# Patient Record
Sex: Male | Born: 1950
Health system: Southern US, Community
[De-identification: ages and names within clinical notes are randomized; demographics above are authoritative.]

## PROBLEM LIST (undated history)

## (undated) DIAGNOSIS — M1990 Unspecified osteoarthritis, unspecified site: Secondary | ICD-10-CM

## (undated) DIAGNOSIS — I10 Essential (primary) hypertension: Secondary | ICD-10-CM

## (undated) DIAGNOSIS — K921 Melena: Secondary | ICD-10-CM

## (undated) DIAGNOSIS — R040 Epistaxis: Secondary | ICD-10-CM

## (undated) DIAGNOSIS — G709 Myoneural disorder, unspecified: Secondary | ICD-10-CM

## (undated) DIAGNOSIS — I6521 Occlusion and stenosis of right carotid artery: Secondary | ICD-10-CM

## (undated) DIAGNOSIS — R3129 Other microscopic hematuria: Secondary | ICD-10-CM

## (undated) DIAGNOSIS — K219 Gastro-esophageal reflux disease without esophagitis: Secondary | ICD-10-CM

## (undated) DIAGNOSIS — E041 Nontoxic single thyroid nodule: Secondary | ICD-10-CM

## (undated) HISTORY — DX: Melena: K92.1

## (undated) HISTORY — DX: Myoneural disorder, unspecified: G70.9

## (undated) HISTORY — DX: Occlusion and stenosis of right carotid artery: I65.21

## (undated) HISTORY — DX: Epistaxis: R04.0

## (undated) HISTORY — DX: Unspecified osteoarthritis, unspecified site: M19.90

## (undated) HISTORY — PX: COLONOSCOPY: SHX174

## (undated) HISTORY — DX: Other microscopic hematuria: R31.29

## (undated) HISTORY — DX: Nontoxic single thyroid nodule: E04.1

## (undated) HISTORY — PX: SHOULDER SURGERY: SHX246

## (undated) HISTORY — DX: Gastro-esophageal reflux disease without esophagitis: K21.9

---

## 2008-08-20 ENCOUNTER — Emergency Department (HOSPITAL_COMMUNITY): Admission: EM | Admit: 2008-08-20 | Discharge: 2008-08-20 | Payer: Self-pay | Admitting: Family Medicine

## 2008-08-28 ENCOUNTER — Emergency Department (HOSPITAL_COMMUNITY): Admission: EM | Admit: 2008-08-28 | Discharge: 2008-08-28 | Payer: Self-pay | Admitting: Family Medicine

## 2009-11-09 ENCOUNTER — Emergency Department (HOSPITAL_COMMUNITY): Admission: EM | Admit: 2009-11-09 | Discharge: 2009-11-09 | Payer: Self-pay | Admitting: Family Medicine

## 2010-01-02 ENCOUNTER — Ambulatory Visit (HOSPITAL_BASED_OUTPATIENT_CLINIC_OR_DEPARTMENT_OTHER): Admission: RE | Admit: 2010-01-02 | Discharge: 2010-01-02 | Payer: Self-pay | Admitting: Orthopedic Surgery

## 2010-06-29 LAB — POCT HEMOGLOBIN-HEMACUE: Hemoglobin: 13.4 g/dL (ref 13.0–17.0)

## 2010-07-25 LAB — POCT URINALYSIS DIP (DEVICE)
Bilirubin Urine: NEGATIVE
Glucose, UA: NEGATIVE mg/dL
Hgb urine dipstick: NEGATIVE
Ketones, ur: NEGATIVE mg/dL
Nitrite: NEGATIVE
Protein, ur: 30 mg/dL — AB
Specific Gravity, Urine: 1.02 (ref 1.005–1.030)
Urobilinogen, UA: 1 mg/dL (ref 0.0–1.0)
pH: 7.5 (ref 5.0–8.0)

## 2010-07-25 LAB — HERPES SIMPLEX VIRUS CULTURE: Culture: NOT DETECTED

## 2010-07-25 LAB — RPR: RPR Ser Ql: NONREACTIVE

## 2010-07-25 LAB — HIV ANTIBODY (ROUTINE TESTING W REFLEX): HIV: NONREACTIVE

## 2014-03-15 ENCOUNTER — Encounter (HOSPITAL_COMMUNITY): Payer: Self-pay | Admitting: Emergency Medicine

## 2014-03-15 ENCOUNTER — Emergency Department (HOSPITAL_COMMUNITY)
Admission: EM | Admit: 2014-03-15 | Discharge: 2014-03-15 | Disposition: A | Discharge status: Home / Self Care | Payer: BC Managed Care – PPO | Source: Home / Self Care | Attending: Emergency Medicine | Admitting: Emergency Medicine

## 2014-03-15 DIAGNOSIS — J069 Acute upper respiratory infection, unspecified: Secondary | ICD-10-CM

## 2014-03-15 HISTORY — DX: Essential (primary) hypertension: I10

## 2014-03-15 MED ORDER — IPRATROPIUM BROMIDE 0.06 % NA SOLN: 2.0000 | Freq: Four times a day (QID) | NASAL | Status: DC

## 2014-03-15 MED ORDER — PREDNISONE 20 MG PO TABS: 20.0000 mg | ORAL_TABLET | Freq: Two times a day (BID) | ORAL | Status: DC

## 2014-03-15 MED ORDER — HYDROCOD POLST-CHLORPHEN POLST 10-8 MG/5ML PO LQCR
5.0000 mL | Freq: Two times a day (BID) | ORAL | Status: DC | PRN
Start: 2014-03-15 — End: 2017-05-08

## 2014-03-15 MED ORDER — ALBUTEROL SULFATE HFA 108 (90 BASE) MCG/ACT IN AERS: 1.0000 | INHALATION_SPRAY | Freq: Four times a day (QID) | RESPIRATORY_TRACT | Status: DC | PRN

## 2014-03-15 NOTE — ED Notes (Signed)
couugh  X  5  Days     Sitting  Upright  On then exam table  No  Acute  distress

## 2014-03-15 NOTE — ED Provider Notes (Signed)
  Chief Complaint   Cough   History of Present Illness   Stephen Butler is a 63 year old male who has had a six-day history of cough productive yellow sputum, nasal congestion, rhinorrhea, headache, subjective fever, chills, sweats, and sore throat. He denies any GI symptoms. No sick exposures.  Review of Systems   Other than as noted above, the patient denies any of the following symptoms: Systemic:  No fevers, chills, sweats, or myalgias. Eye:  No redness or discharge. ENT:  No ear pain, headache, nasal congestion, drainage, sinus pressure, or sore throat. Neck:  No neck pain, stiffness, or swollen glands. Lungs:  No cough, sputum production, hemoptysis, wheezing, chest tightness, shortness of breath or chest pain. GI:  No abdominal pain, nausea, vomiting or diarrhea.  [place]   Past medical history, family history, social history, meds, and allergies were reviewed. He has a history of high blood pressure.  Physical exam   Vital signs:  BP 162/88 mmHg  Pulse 66  Temp(Src) 98.2 F (36.8 C) (Oral)  Resp 16  SpO2 96% General:  Alert and oriented.  In no distress.  Skin warm and dry. Eye:  No conjunctival injection or drainage. Lids were normal. ENT:  TMs and canals were normal, without erythema or inflammation.  Nasal mucosa was clear and uncongested, without drainage.  Mucous membranes were moist.  Pharynx was clear with no exudate or drainage.  There were no oral ulcerations or lesions. Neck:  Supple, no adenopathy, tenderness or mass. Lungs:  No respiratory distress.  Lungs were clear to auscultation, without wheezes, rales or rhonchi.  Breath sounds were clear and equal bilaterally.  Heart:  Regular rhythm, without gallops, murmers or rubs. Skin:  Clear, warm, and dry, without rash or lesions.  Assessment     The encounter diagnosis was Viral URI.  There is no evidence of pneumonia, strep throat, sinusitis, otitis media.    Plan    1.  Meds:  The following meds were  prescribed:   Discharge Medication List as of 03/15/2014  2:26 PM    START taking these medications   Details  albuterol (PROVENTIL HFA;VENTOLIN HFA) 108 (90 BASE) MCG/ACT inhaler Inhale 1-2 puffs into the lungs every 6 (six) hours as needed for wheezing or shortness of breath., Starting 03/15/2014, Until Discontinued, Normal    chlorpheniramine-HYDROcodone (TUSSIONEX) 10-8 MG/5ML LQCR Take 5 mLs by mouth every 12 (twelve) hours as needed for cough., Starting 03/15/2014, Until Discontinued, Normal    ipratropium (ATROVENT) 0.06 % nasal spray Place 2 sprays into both nostrils 4 (four) times daily., Starting 03/15/2014, Until Discontinued, Normal    predniSONE (DELTASONE) 20 MG tablet Take 1 tablet (20 mg total) by mouth 2 (two) times daily., Starting 03/15/2014, Until Discontinued, Normal        2.  Patient Education/Counseling:  The patient was given appropriate handouts, self care instructions, and instructed in symptomatic relief.  Instructed to get extra fluids and extra rest.    3.  Follow up:  The patient was told to follow up here if no better in 3 to 4 days, or sooner if becoming worse in any way, and given some red flag symptoms such as increasing fever, difficulty breathing, chest pain, or persistent vomiting which would prompt immediate return.       Harden Mo, MD 03/15/14 [phone]

## 2014-03-15 NOTE — Discharge Instructions (Signed)

## 2015-12-06 ENCOUNTER — Ambulatory Visit (HOSPITAL_COMMUNITY)
Admission: EM | Admit: 2015-12-06 | Discharge: 2015-12-06 | Disposition: A | Discharge status: Home / Self Care | Payer: Self-pay | Attending: Family Medicine | Admitting: Family Medicine

## 2015-12-06 ENCOUNTER — Encounter (HOSPITAL_COMMUNITY): Payer: Self-pay | Admitting: Emergency Medicine

## 2015-12-06 DIAGNOSIS — K0889 Other specified disorders of teeth and supporting structures: Secondary | ICD-10-CM

## 2015-12-06 MED ORDER — KETOROLAC TROMETHAMINE 60 MG/2ML IM SOLN: 60.0000 mg | Freq: Once | INTRAMUSCULAR | Status: DC

## 2015-12-06 MED ORDER — AMOXICILLIN 875 MG PO TABS: 875.0000 mg | ORAL_TABLET | Freq: Two times a day (BID) | ORAL | 0 refills | Status: DC

## 2015-12-06 MED ORDER — NAPROXEN 500 MG PO TABS: ORAL_TABLET | ORAL | 0 refills | Status: DC

## 2015-12-06 NOTE — Discharge Instructions (Signed)

## 2015-12-06 NOTE — ED Triage Notes (Signed)
The patient presented to the [facility] with a complaint of dental pain secondary to a cavity that has not been repaired. The patient reported that the pain started last night.

## 2015-12-06 NOTE — ED Provider Notes (Signed)
CSN: [id number]     Arrival date & time 12/06/15  1558 History   None    Chief Complaint  Patient presents with  . Dental Pain   (Consider location/radiation/quality/duration/timing/severity/associated sxs/prior Treatment) Patient c/o dental pain    The history is provided by the patient.  Dental Pain  Location:  Upper Upper teeth location:  7/RU lateral incisor Quality:  Constant Severity:  Moderate Onset quality:  Sudden Duration:  1 day Timing:  Constant Progression:  Worsening Context: dental caries   Previous work-up:  Dental exam Relieved by:  Nothing Worsened by:  Nothing Ineffective treatments:  None tried   Past Medical History:  Diagnosis Date  . Hypertension    History reviewed. No pertinent surgical history. History reviewed. No pertinent family history. Social History  Substance Use Topics  . Smoking status: Never Smoker  . Smokeless tobacco: Never Used  . Alcohol use No    Review of Systems  Constitutional: Negative.   HENT: Positive for dental problem.   Eyes: Negative.   Respiratory: Negative.   Cardiovascular: Negative.   Gastrointestinal: Negative.   Endocrine: Negative.   Genitourinary: Negative.   Musculoskeletal: Negative.   Skin: Negative.   Allergic/Immunologic: Negative.   Neurological: Negative.   Hematological: Negative.   Psychiatric/Behavioral: Negative.     Allergies  Review of patient's allergies indicates no known allergies.  Home Medications   Prior to Admission medications   Medication Sig Start Date End Date Taking? Authorizing Provider  albuterol (PROVENTIL HFA;VENTOLIN HFA) 108 (90 BASE) MCG/ACT inhaler Inhale 1-2 puffs into the lungs every 6 (six) hours as needed for wheezing or shortness of breath. 03/15/14   Harden Mo, MD  amoxicillin (AMOXIL) 875 MG tablet Take 1 tablet (875 mg total) by mouth 2 (two) times daily. 12/06/15   Lysbeth Penner, FNP  chlorpheniramine-HYDROcodone (TUSSIONEX) 10-8 MG/5ML LQCR  Take 5 mLs by mouth every 12 (twelve) hours as needed for cough. 03/15/14   Harden Mo, MD  ipratropium (ATROVENT) 0.06 % nasal spray Place 2 sprays into both nostrils 4 (four) times daily. 03/15/14   Harden Mo, MD  naproxen (NAPROSYN) 500 MG tablet One po bid prn pain 12/06/15   Lysbeth Penner, FNP  predniSONE (DELTASONE) 20 MG tablet Take 1 tablet (20 mg total) by mouth 2 (two) times daily. 03/15/14   Harden Mo, MD   Meds Ordered and Administered this Visit   Medications  ketorolac (TORADOL) injection 60 mg (not administered)    BP 180/95 (BP Location: Left Arm)   Pulse 62   Temp 98.1 F (36.7 C) (Oral)   Resp 18   Ht 5\' 8"  (1.727 m)   Wt 204 lb (92.5 kg)   SpO2 100%   BMI 31.02 kg/m  No data found.   Physical Exam  Constitutional: He is oriented to person, place, and time. He appears well-developed and well-nourished.  HENT:  Head: Normocephalic and atraumatic.  Right Ear: External ear normal.  Left Ear: External ear normal.  Mouth/Throat: Oropharynx is clear and moist.  Eyes: Conjunctivae and EOM are normal. Pupils are equal, round, and reactive to light.  Neck: Normal range of motion. Neck supple.  Cardiovascular: Normal rate, regular rhythm, normal heart sounds and intact distal pulses.   Pulmonary/Chest: Effort normal and breath sounds normal.  Abdominal: Soft. Bowel sounds are normal.  Musculoskeletal: Normal range of motion.  Neurological: He is alert and oriented to person, place, and time. He has normal reflexes.  Nursing  note and vitals reviewed.   Urgent Care Course   Clinical Course    Procedures (including critical care time)  Labs Review Labs Reviewed - No data to display  Imaging Review No results found.   Visual Acuity Review  Right Eye Distance:   Left Eye Distance:   Bilateral Distance:    Right Eye Near:   Left Eye Near:    Bilateral Near:         MDM   1. Pain, dental    Toradol 60mg  IM Naprosyn 500mg  one  po bid prn pain #20 Amoxicillin 875mg  one po bid x 10 days #20    Lysbeth Penner, FNP 12/06/15 1729

## 2016-07-16 ENCOUNTER — Encounter (HOSPITAL_COMMUNITY): Payer: Self-pay | Admitting: Emergency Medicine

## 2016-07-16 ENCOUNTER — Emergency Department (HOSPITAL_COMMUNITY): Payer: BC Managed Care – PPO

## 2016-07-16 ENCOUNTER — Emergency Department (HOSPITAL_COMMUNITY)
Admission: EM | Admit: 2016-07-16 | Discharge: 2016-07-17 | Disposition: A | Discharge status: Home / Self Care | Payer: BC Managed Care – PPO | Attending: Emergency Medicine | Admitting: Emergency Medicine

## 2016-07-16 DIAGNOSIS — Y929 Unspecified place or not applicable: Secondary | ICD-10-CM | POA: Insufficient documentation

## 2016-07-16 DIAGNOSIS — I10 Essential (primary) hypertension: Secondary | ICD-10-CM | POA: Diagnosis not present

## 2016-07-16 DIAGNOSIS — Y999 Unspecified external cause status: Secondary | ICD-10-CM | POA: Insufficient documentation

## 2016-07-16 DIAGNOSIS — S99922A Unspecified injury of left foot, initial encounter: Secondary | ICD-10-CM | POA: Diagnosis present

## 2016-07-16 DIAGNOSIS — S90112A Contusion of left great toe without damage to nail, initial encounter: Secondary | ICD-10-CM | POA: Diagnosis not present

## 2016-07-16 DIAGNOSIS — Z79899 Other long term (current) drug therapy: Secondary | ICD-10-CM | POA: Insufficient documentation

## 2016-07-16 DIAGNOSIS — Y939 Activity, unspecified: Secondary | ICD-10-CM | POA: Diagnosis not present

## 2016-07-16 DIAGNOSIS — W208XXA Other cause of strike by thrown, projected or falling object, initial encounter: Secondary | ICD-10-CM | POA: Insufficient documentation

## 2016-07-16 NOTE — ED Triage Notes (Signed)
Pt to ED from home c/o L great toe pain - states he dropped a heavy table right on it earlier today. Pt unable to bear full weight on left leg, walking with a limp. No obvious signs of injury noted.

## 2016-07-16 NOTE — Discharge Instructions (Signed)
Please read and follow all provided instructions.  Your diagnoses today include:  1. Contusion of left great toe without damage to nail, initial encounter     Tests performed today include:  An x-ray of the affected area - does NOT show any broken bones  Vital signs. See below for your results today.   Medications prescribed:  Use OTC tylenol or ibuprofen as directed on the packaging.   Take any prescribed medications only as directed.  Home care instructions:   Follow any educational materials contained in this packet  Follow R.I.C.E. Protocol:  R - rest your injury   I  - use ice on injury without applying directly to skin  C - compress injury with bandage or splint  E - elevate the injury as much as possible  Follow-up instructions: Please follow-up with your primary care provider if you continue to have significant pain in 1 week. In this case you may have a more severe injury that requires further care.   Return instructions:   Please return if your toes or feet are numb or tingling, appear gray or blue, or you have severe pain (also elevate the leg and loosen splint or wrap if you were given one)  Please return to the Emergency Department if you experience worsening symptoms.   Please return if you have any other emergent concerns.  Additional Information:  Your vital signs today were: BP (!) 157/75 (BP Location: Left Arm)    Pulse 86    Temp 97.8 F (36.6 C) (Oral)    Resp 18    Ht 5\' 8"  (1.727 m)    Wt 95.3 kg    SpO2 96%    BMI 31.93 kg/m  If your blood pressure (BP) was elevated above 135/85 this visit, please have this repeated by your doctor within one month. --------------

## 2016-07-17 NOTE — ED Provider Notes (Signed)
[facility] DEPT Provider Note   CSN: [id number] Arrival date & time: 07/16/16  2222     History   Chief Complaint Chief Complaint  Patient presents with  . Toe Pain    HPI Stephen Butler is a 66 y.o. male.  Patient presents with complaint of left great toe pain after dropping a wooden table on it this afternoon. Patient is ambulatory but with a limp. No treatments prior to arrival. Denies other injury. The onset of this condition was acute. The course is constant. Aggravating factors: pressure. Alleviating factors: none.        Past Medical History:  Diagnosis Date  . Hypertension     There are no active problems to display for this patient.   History reviewed. No pertinent surgical history.     Home Medications    Prior to Admission medications   Medication Sig Start Date End Date Taking? Authorizing Provider  albuterol (PROVENTIL HFA;VENTOLIN HFA) 108 (90 BASE) MCG/ACT inhaler Inhale 1-2 puffs into the lungs every 6 (six) hours as needed for wheezing or shortness of breath. 03/15/14   Harden Mo, MD  amoxicillin (AMOXIL) 875 MG tablet Take 1 tablet (875 mg total) by mouth 2 (two) times daily. 12/06/15   Lysbeth Penner, FNP  chlorpheniramine-HYDROcodone (TUSSIONEX) 10-8 MG/5ML LQCR Take 5 mLs by mouth every 12 (twelve) hours as needed for cough. 03/15/14   Harden Mo, MD  ipratropium (ATROVENT) 0.06 % nasal spray Place 2 sprays into both nostrils 4 (four) times daily. 03/15/14   Harden Mo, MD  naproxen (NAPROSYN) 500 MG tablet One po bid prn pain 12/06/15   Lysbeth Penner, FNP  predniSONE (DELTASONE) 20 MG tablet Take 1 tablet (20 mg total) by mouth 2 (two) times daily. 03/15/14   Harden Mo, MD    Family History No family history on file.  Social History Social History  Substance Use Topics  . Smoking status: Never Smoker  . Smokeless tobacco: Never Used  . Alcohol use No     Allergies   Patient has no known  allergies.   Review of Systems Review of Systems  Constitutional: Negative for activity change.  Musculoskeletal: Positive for arthralgias and gait problem. Negative for back pain, joint swelling and neck pain.  Skin: Negative for wound.  Neurological: Negative for weakness and numbness.     Physical Exam Updated Vital Signs BP (!) 157/75 (BP Location: Left Arm)   Pulse 86   Temp 97.8 F (36.6 C) (Oral)   Resp 18   Ht 5\' 8"  (1.727 m)   Wt 95.3 kg   SpO2 96%   BMI 31.93 kg/m   Physical Exam  Constitutional: He appears well-developed and well-nourished.  HENT:  Head: Normocephalic and atraumatic.  Eyes: Conjunctivae are normal.  Neck: Normal range of motion. Neck supple.  Cardiovascular: Normal pulses.  Exam reveals no decreased pulses.   Musculoskeletal: He exhibits tenderness. He exhibits no edema.       Feet:  Neurological: He is alert. No sensory deficit.  Motor, sensation, and vascular distal to the injury is fully intact.   Skin: Skin is warm and dry.  Psychiatric: He has a normal mood and affect.  Nursing note and vitals reviewed.    ED Treatments / Results   Radiology Dg Foot Complete Left  Result Date: 07/16/2016 CLINICAL DATA:  Dropped heavy wooden table on left foot with pain to the great toe EXAM: LEFT FOOT - COMPLETE 3+ VIEW COMPARISON:  None. FINDINGS: No acute displaced fracture or malalignment. Degenerative changes at the first MTP joint. Moderate plantar calcaneal spur. IMPRESSION: No definite acute osseous abnormality Electronically Signed   By: Donavan Foil M.D.   On: 07/16/2016 23:08    Procedures Procedures (including critical care time)  Medications Ordered in ED Medications - No data to display   Initial Impression / Assessment and Plan / ED Course  I have reviewed the triage vital signs and the nursing notes.  Pertinent labs & imaging results that were available during my care of the patient were reviewed by me and considered in my  medical decision making (see chart for details).     Patient seen and examined.   Vital signs reviewed and are as follows: BP (!) 157/75 (BP Location: Left Arm)   Pulse 86   Temp 97.8 F (36.6 C) (Oral)   Resp 18   Ht 5\' 8"  (1.727 m)   Wt 95.3 kg   SpO2 96%   BMI 31.93 kg/m   Will buddy tape. Discussed rice protocol with patient. Discussed use of over-the-counter medications for pain control. Work note given for 1 day.  Final Clinical Impressions(s) / ED Diagnoses   Final diagnoses:  Contusion of left great toe without damage to nail, initial encounter   Patient with contusion of left great toe. No open wounds. Nail intact. X-rays negative. Foot and toes are neurovascularly intact.  New Prescriptions New Prescriptions   No medications on file     Carl[place] Cater, PA-C 07/17/16 0011    Merrily Pew, MD 07/18/16 [phone]

## 2016-07-17 NOTE — ED Notes (Signed)
Pt verbalized understanding of d/c instructions and has no further questions. Pt is stable, A&Ox4, VSS.  

## 2017-03-19 ENCOUNTER — Encounter: Payer: Self-pay | Admitting: Gastroenterology

## 2017-04-30 NOTE — Progress Notes (Signed)
Referral paperwork

## 2017-05-08 ENCOUNTER — Encounter: Payer: Self-pay | Admitting: Gastroenterology

## 2017-05-08 ENCOUNTER — Other Ambulatory Visit: Payer: Self-pay | Admitting: *Deleted

## 2017-05-08 ENCOUNTER — Ambulatory Visit: Payer: BC Managed Care – PPO | Admitting: Gastroenterology

## 2017-05-08 ENCOUNTER — Other Ambulatory Visit (INDEPENDENT_AMBULATORY_CARE_PROVIDER_SITE_OTHER): Payer: BC Managed Care – PPO

## 2017-05-08 VITALS — BP 158/86 | HR 78 | Ht 68.5 in | Wt 209.0 lb

## 2017-05-08 DIAGNOSIS — K921 Melena: Secondary | ICD-10-CM | POA: Diagnosis not present

## 2017-05-08 DIAGNOSIS — R748 Abnormal levels of other serum enzymes: Secondary | ICD-10-CM

## 2017-05-08 DIAGNOSIS — K625 Hemorrhage of anus and rectum: Secondary | ICD-10-CM

## 2017-05-08 LAB — GAMMA GT: GGT: 27 U/L (ref 7–51)

## 2017-05-08 LAB — HEPATIC FUNCTION PANEL
ALBUMIN: 4 g/dL (ref 3.5–5.2)
ALT: 24 U/L (ref 0–53)
AST: 20 U/L (ref 0–37)
Alkaline Phosphatase: 140 U/L — ABNORMAL HIGH (ref 39–117)
BILIRUBIN DIRECT: 0.1 mg/dL (ref 0.0–0.3)
TOTAL PROTEIN: 7 g/dL (ref 6.0–8.3)
Total Bilirubin: 0.3 mg/dL (ref 0.2–1.2)

## 2017-05-08 MED ORDER — NA SULFATE-K SULFATE-MG SULF 17.5-3.13-1.6 GM/177ML PO SOLN: 1.0000 | Freq: Once | ORAL | 0 refills | Status: AC

## 2017-05-08 NOTE — Progress Notes (Signed)
HPI :  67 y/o male with a history of GERD, HTN, and hematochezia, here for a new patient visit to be evaluated for rectal bleeding.   He reports intermittent blood in his stools for the past one and half years. He states it comes and goes, is usually bright red blood that is mixed in with the stools. He states this is painless. He states he has a history of hemorrhoid remotely but he doesn't think these are bothering him recently. He has roughly 1-2 bowel limbs per day. He denies any constipation or diarrhea. He denies any significant straining. He denies any abdominal pains which bother him. He states he is eating well. No weight loss.  He denies any history of liver problems. On review of labs his alkaline phosphatase is noted to be elevated at 145. Rest of his liver function testing is normal. Hemoglobin is normal. He thinks he had a colonoscopy in [place] sometime in the 1990s, no records of that exam. He denies any family history of colon cancer. He denies any alcohol use.  He does have a family history of prostate cancer lung cancer in his brothers.  Labs: 03/15/2017 Hgb 13.3, HCT 41.0, plt 194, WBC 5.6, MCV 81  AP 145, AST 28, ALT 30, T bil 0.3  Past Medical History:  Diagnosis Date  . Epistaxis   . GERD (gastroesophageal reflux disease)   . Hematochezia   . Hypertension   . Microscopic hematuria      Past Surgical History:  Procedure Laterality Date  . SHOULDER SURGERY     Family History  Problem Relation Age of Onset  . Hypertension Mother    Social History   Tobacco Use  . Smoking status: Never Smoker  . Smokeless tobacco: Never Used  Substance Use Topics  . Alcohol use: No  . Drug use: No   Current Outpatient Medications  Medication Sig Dispense Refill  . amLODipine (NORVASC) 5 MG tablet Take 5 mg by mouth daily.     No current facility-administered medications for this visit.    No Known Allergies   Review of Systems: All systems reviewed and  negative except where noted in HPI.   Per HPI  Physical Exam: BP (!) 158/86   Pulse 78   Ht 5' 8.5" (1.74 m)   Wt 209 lb (94.8 kg)   BMI 31.32 kg/m  Constitutional: Pleasant,well-developed, male in no acute distress. HEENT: Normocephalic and atraumatic. Conjunctivae are normal. No scleral icterus. Neck supple.  Cardiovascular: Normal rate, regular rhythm.  Pulmonary/chest: Effort normal and breath sounds normal. No wheezing, rales or rhonchi. Abdominal: Soft, nondistended, nontender.  There are no masses palpable. No hepatomegaly. Extremities: no edema Lymphadenopathy: No cervical adenopathy noted. Neurological: Alert and oriented to person place and time. Skin: Skin is warm and dry. No rashes noted. Psychiatric: Normal mood and affect. Behavior is normal.   ASSESSMENT AND PLAN: 67 year old male presenting with intermittent rectal bleeding which is painless. No other associated symptoms. His last colonoscopy was a very long time ago, and I recommend a colonoscopy to further evaluate these symptoms. I discussed the risks and benefits of colonoscopy and anesthesia with him and he wanted to proceed. If his symptoms are due to hemorrhoids we discussed potential management options, he would be interested in banding.   Otherwise, incidentally noted to have elevated AP on one lab draw. Will repeat with GGT, if remains elevated will further workup. He agreed.   [place] Cellar, MD [facility] Pager [phone]

## 2017-05-08 NOTE — Patient Instructions (Addendum)
If you are age 65 or older, your body mass index should be between 23-30. Your Body mass index is 31.32 kg/m. If this is out of the aforementioned range listed, please consider follow up with your Primary Care Provider.  If you are age 77 or younger, your body mass index should be between 19-25. Your Body mass index is 31.32 kg/m. If this is out of the aformentioned range listed, please consider follow up with your Primary Care Provider.   You have been scheduled for a colonoscopy. Please follow written instructions given to you at your visit today.  Please pick up your prep supplies at the pharmacy within the next 1-3 days. If you use inhalers (even only as needed), please bring them with you on the day of your procedure. Your physician has requested that you go to www.startemmi.com and enter the access code given to you at your visit today. This web site gives a general overview about your procedure. However, you should still follow specific instructions given to you by our office regarding your preparation for the procedure.  Please go to the lab in the basement of our building to have lab work done as you leave today.   Thank you for entrusting me with your care and for choosing [facility], Dr. [place] Cellar

## 2017-05-14 ENCOUNTER — Encounter: Payer: Self-pay | Admitting: Gastroenterology

## 2017-05-28 ENCOUNTER — Encounter: Payer: Self-pay | Admitting: Gastroenterology

## 2017-05-28 ENCOUNTER — Ambulatory Visit (AMBULATORY_SURGERY_CENTER): Payer: BC Managed Care – PPO | Admitting: Gastroenterology

## 2017-05-28 ENCOUNTER — Other Ambulatory Visit: Payer: Self-pay

## 2017-05-28 VITALS — BP 144/98 | HR 66 | Temp 98.6°F | Resp 12 | Ht 68.0 in | Wt 209.0 lb

## 2017-05-28 DIAGNOSIS — D125 Benign neoplasm of sigmoid colon: Secondary | ICD-10-CM

## 2017-05-28 DIAGNOSIS — D123 Benign neoplasm of transverse colon: Secondary | ICD-10-CM

## 2017-05-28 DIAGNOSIS — D120 Benign neoplasm of cecum: Secondary | ICD-10-CM | POA: Diagnosis not present

## 2017-05-28 DIAGNOSIS — D128 Benign neoplasm of rectum: Secondary | ICD-10-CM

## 2017-05-28 DIAGNOSIS — K625 Hemorrhage of anus and rectum: Secondary | ICD-10-CM | POA: Diagnosis not present

## 2017-05-28 DIAGNOSIS — K635 Polyp of colon: Secondary | ICD-10-CM

## 2017-05-28 MED ORDER — SODIUM CHLORIDE 0.9 % IV SOLN: 500.0000 mL | INTRAVENOUS | Status: DC

## 2017-05-28 NOTE — Progress Notes (Signed)
Pt's states no medical or surgical changes since previsit or office visit. 

## 2017-05-28 NOTE — Progress Notes (Signed)
Called to room to assist during endoscopic procedure.  Patient ID and intended procedure confirmed with present staff. Received instructions for my participation in the procedure from the performing physician.  

## 2017-05-28 NOTE — Progress Notes (Signed)
To PACU, VSS. Report to [email or web address] 

## 2017-05-28 NOTE — Op Note (Signed)
[facility] Patient Name: Stephen Butler Procedure Date: 05/28/2017 3:48 PM MRN: [id number] Endoscopist: Remo Lipps P. Carisha Kantor MD, MD Age: 67 Referring MD:  Date of Birth: [birth year] Gender: Male Account #: [id number] Procedure:                Colonoscopy Indications:              Hematochezia Medicines:                Monitored Anesthesia Care Procedure:                Pre-Anesthesia Assessment:                           - Prior to the procedure, a History and Physical                            was performed, and patient medications and                            allergies were reviewed. The patient's tolerance of                            previous anesthesia was also reviewed. The risks                            and benefits of the procedure and the sedation                            options and risks were discussed with the patient.                            All questions were answered, and informed consent                            was obtained. Prior Anticoagulants: The patient has                            taken no previous anticoagulant or antiplatelet                            agents. ASA Grade Assessment: II - A patient with                            mild systemic disease. After reviewing the risks                            and benefits, the patient was deemed in                            satisfactory condition to undergo the procedure.                           After obtaining informed consent, the colonoscope  was passed under direct vision. Throughout the                            procedure, the patient's blood pressure, pulse, and                            oxygen saturations were monitored continuously. The                            Colonoscope was introduced through the anus and                            advanced to the the cecum, identified by                            appendiceal orifice and ileocecal valve. The                         colonoscopy was performed without difficulty. The                            patient tolerated the procedure well. The quality                            of the bowel preparation was good. The ileocecal                            valve, appendiceal orifice, and rectum were                            photographed. Scope In: 3:53:09 PM Scope Out: 4:14:14 PM Scope Withdrawal Time: 0 hours 18 minutes 41 seconds  Total Procedure Duration: 0 hours 21 minutes 5 seconds  Findings:                 The perianal and digital rectal examinations were                            normal.                           A 4 mm polyp was found in the cecum. The polyp was                            sessile. The polyp was removed with a cold snare.                            Resection and retrieval were complete.                           Two sessile polyps were found in the proximal                            transverse colon. The polyps were 3 to 5 mm in  size. These polyps were removed with a cold snare.                            Resection and retrieval were complete.                           A 15 mm polyp was found in the sigmoid colon. The                            polyp was pedunculated. The polyp was removed with                            a hot snare. Resection and retrieval were complete.                           A 8 mm polyp was found in the rectum. The polyp was                            semi-pedunculated. The polyp was removed with a hot                            snare. Resection and retrieval were complete.                           A 4 mm polyp was found in the rectum. The polyp was                            sessile. The polyp was removed with a cold snare.                            Resection and retrieval were complete.                           Anal papilla(e) were hypertrophied.                           Internal hemorrhoids were found during                             retroflexion. The hemorrhoids were moderate.                           The exam was otherwise without abnormality. Complications:            No immediate complications. Estimated blood loss:                            Minimal. Estimated Blood Loss:     Estimated blood loss was minimal. Impression:               - One 4 mm polyp in the cecum, removed with a cold                            snare. Resected  and retrieved.                           - Two 3 to 5 mm polyps in the proximal transverse                            colon, removed with a cold snare. Resected and                            retrieved.                           - One 15 mm polyp in the sigmoid colon, removed                            with a hot snare. Resected and retrieved.                           - One 8 mm polyp in the rectum, removed with a hot                            snare. Resected and retrieved.                           - One 4 mm polyp in the rectum, removed with a cold                            snare. Resected and retrieved.                           - Anal papilla(e) were hypertrophied.                           - Internal hemorrhoids.                           - The examination was otherwise normal. Recommendation:           - Patient has a contact number available for                            emergencies. The signs and symptoms of potential                            delayed complications were discussed with the                            patient. Return to normal activities tomorrow.                            Written discharge instructions were provided to the                            patient.                           -  Resume previous diet.                           - Continue present medications.                           - Await pathology results.                           - Repeat colonoscopy is recommended for                            surveillance. The  colonoscopy date will be                            determined after pathology results from today's                            exam become available for review.                           - No ibuprofen, naproxen, or other non-steroidal                            anti-inflammatory drugs for 2 weeks after polyp                            removal. Remo Lipps P. Zarie Kosiba MD, MD 05/28/2017 4:19:37 PM This report has been signed electronically.

## 2017-05-28 NOTE — Patient Instructions (Signed)
YOU HAD AN ENDOSCOPIC PROCEDURE TODAY AT [facility]:   Refer to the procedure report that was given to you for any specific questions about what was found during the examination.  If the procedure report does not answer your questions, please call your gastroenterologist to clarify.  If you requested that your care partner not be given the details of your procedure findings, then the procedure report has been included in a sealed envelope for you to review at your convenience later.  YOU SHOULD EXPECT: Some feelings of bloating in the abdomen. Passage of more gas than usual.  Walking can help get rid of the air that was put into your GI tract during the procedure and reduce the bloating. If you had a lower endoscopy (such as a colonoscopy or flexible sigmoidoscopy) you may notice spotting of blood in your stool or on the toilet paper. If you underwent a bowel prep for your procedure, you may not have a normal bowel movement for a few days.  Please Note:  You might notice some irritation and congestion in your nose or some drainage.  This is from the oxygen used during your procedure.  There is no need for concern and it should clear up in a day or so.  SYMPTOMS TO REPORT IMMEDIATELY:   Following lower endoscopy (colonoscopy or flexible sigmoidoscopy):  Excessive amounts of blood in the stool  Significant tenderness or worsening of abdominal pains  Swelling of the abdomen that is new, acute  Fever of 100F or higher  Please see  Handouts given to you on polyps and hemorrhoids. No Ibuprofen, Aleve, motrin or NSAIDS for 2 weeks.  For urgent or emergent issues, a gastroenterologist can be reached at any hour by calling [phone].   DIET:  We do recommend a small meal at first, but then you may proceed to your regular diet.  Drink plenty of fluids but you should avoid alcoholic beverages for 24 hours.  ACTIVITY:  You should plan to take it easy for the rest of today and you  should NOT DRIVE or use heavy machinery until tomorrow (because of the sedation medicines used during the test).    FOLLOW UP: Our staff will call the number listed on your records the next business day following your procedure to check on you and address any questions or concerns that you may have regarding the information given to you following your procedure. If we do not reach you, we will leave a message.  However, if you are feeling well and you are not experiencing any problems, there is no need to return our call.  We will assume that you have returned to your regular daily activities without incident.  If any biopsies were taken you will be contacted by phone or by letter within the next 1-3 weeks.  Please call us at [phone] if you have not heard about the biopsies in 3 weeks.    SIGNATURES/CONFIDENTIALITY: You and/or your care partner have signed paperwork which will be entered into your electronic medical record.  These signatures attest to the fact that that the information above on your After Visit Summary has been reviewed and is understood.  Full responsibility of the confidentiality of this discharge information lies with you and/or your care-partner.  Thank you for letting us take care of your healthcare needs today.

## 2017-05-29 ENCOUNTER — Telehealth: Payer: Self-pay | Admitting: *Deleted

## 2017-05-29 NOTE — Telephone Encounter (Signed)
  Follow up Call-  Call back number 05/28/2017  Post procedure Call Back phone  # [phone]  Permission to leave phone message Yes  Some recent data might be hidden     Patient questions:  Do you have a fever, pain , or abdominal swelling? No. Pain Score  0 *  Have you tolerated food without any problems? Yes.    Have you been able to return to your normal activities? Yes.    Do you have any questions about your discharge instructions: Diet   No. Medications  No. Follow up visit  No.  Do you have questions or concerns about your Care? No.  Actions: * If pain score is 4 or above: No action needed, pain <4.

## 2017-06-04 ENCOUNTER — Encounter: Payer: Self-pay | Admitting: Gastroenterology

## 2017-06-14 DIAGNOSIS — K649 Unspecified hemorrhoids: Secondary | ICD-10-CM | POA: Diagnosis not present

## 2017-06-14 DIAGNOSIS — I10 Essential (primary) hypertension: Secondary | ICD-10-CM | POA: Diagnosis not present

## 2017-06-14 DIAGNOSIS — M545 Low back pain: Secondary | ICD-10-CM | POA: Diagnosis not present

## 2017-07-02 ENCOUNTER — Encounter: Payer: Self-pay | Admitting: Family Medicine

## 2017-07-02 ENCOUNTER — Ambulatory Visit (INDEPENDENT_AMBULATORY_CARE_PROVIDER_SITE_OTHER): Payer: BC Managed Care – PPO | Admitting: Family Medicine

## 2017-07-02 VITALS — BP 136/80 | HR 62 | Temp 98.3°F | Resp 14 | Ht 68.0 in | Wt 204.0 lb

## 2017-07-02 DIAGNOSIS — M79644 Pain in right finger(s): Secondary | ICD-10-CM

## 2017-07-02 DIAGNOSIS — Z7689 Persons encountering health services in other specified circumstances: Secondary | ICD-10-CM | POA: Diagnosis not present

## 2017-07-02 DIAGNOSIS — Z0000 Encounter for general adult medical examination without abnormal findings: Secondary | ICD-10-CM | POA: Diagnosis not present

## 2017-07-02 DIAGNOSIS — Z1159 Encounter for screening for other viral diseases: Secondary | ICD-10-CM

## 2017-07-02 DIAGNOSIS — Z23 Encounter for immunization: Secondary | ICD-10-CM | POA: Diagnosis not present

## 2017-07-02 DIAGNOSIS — Z125 Encounter for screening for malignant neoplasm of prostate: Secondary | ICD-10-CM

## 2017-07-02 MED ORDER — SILDENAFIL CITRATE 100 MG PO TABS: 50.0000 mg | ORAL_TABLET | Freq: Every day | ORAL | 11 refills | Status: DC | PRN

## 2017-07-02 NOTE — Progress Notes (Signed)
Subjective:    Patient ID: Stephen Butler, male    DOB: May 10, 1950, 67 y.o.   MRN: [id number]  HPI  Patient used to be a patient here more than 5 years ago.  Recently had a colonoscopy there was significant for more than 6 polyps that turned out to be tubular adenomas as well as internal hemorrhoids.  He continues to have occasional bright red blood per rectum.  I suspect that this is due to his internal hemorrhoids.  If problematic, I recommended that the patient contact his gastroenterologist for possible banding and ligation.  He also complains of pain in his right thumb at the first MCP joint.  He has a positive Finkelstein's test.  He has tenderness to palpation around the first MCP joint.  He denies any falls or injuries.  Pain is been present for several weeks.  He also has a past medical history of hypertension.  He is overdue for hepatitis C screening as well as a PSA Past Medical History:  Diagnosis Date  . Epistaxis   . GERD (gastroesophageal reflux disease)   . Hematochezia   . Hypertension   . Microscopic hematuria    Past Surgical History:  Procedure Laterality Date  . SHOULDER SURGERY     Current Outpatient Medications on File Prior to Visit  Medication Sig Dispense Refill  . amLODipine (NORVASC) 5 MG tablet Take 5 mg by mouth daily.    . traMADol-acetaminophen (ULTRACET) 37.5-325 MG tablet Take 2 tablets by mouth every 6 (six) hours as needed.     Current Facility-Administered Medications on File Prior to Visit  Medication Dose Route Frequency Provider Last Rate Last Dose  . 0.9 %  sodium chloride infusion  500 mL Intravenous Continuous Armbruster, Carlota Raspberry, MD       No Known Allergies Social History   Socioeconomic History  . Marital status: Legally Separated    Spouse name: Not on file  . Number of children: Not on file  . Years of education: Not on file  . Highest education level: Not on file  Social Needs  . Financial resource strain: Not on file  . Food  insecurity - worry: Not on file  . Food insecurity - inability: Not on file  . Transportation needs - medical: Not on file  . Transportation needs - non-medical: Not on file  Occupational History  . Not on file  Tobacco Use  . Smoking status: Never Smoker  . Smokeless tobacco: Never Used  Substance and Sexual Activity  . Alcohol use: No  . Drug use: No  . Sexual activity: Not on file  Other Topics Concern  . Not on file  Social History Narrative  . Not on file   Family History  Problem Relation Age of Onset  . Hypertension Mother      Review of Systems  All other systems reviewed and are negative.      Objective:   Physical Exam  Constitutional: He appears well-developed and well-nourished. No distress.  Neck: Neck supple. No JVD present. No thyromegaly present.  Cardiovascular: Normal rate, regular rhythm and normal heart sounds. Exam reveals no gallop and no friction rub.  No murmur heard. Pulmonary/Chest: Effort normal and breath sounds normal. No respiratory distress. He has no wheezes. He has no rales. He exhibits no tenderness.  Abdominal: Soft. Bowel sounds are normal. He exhibits no distension. There is no tenderness. There is no rebound and no guarding.  Musculoskeletal: He exhibits no edema.  Lymphadenopathy:  He has no cervical adenopathy.  Skin: He is not diaphoretic.  Vitals reviewed.         Assessment & Plan:  Thumb pain, right - Plan: DG Hand Complete Right  Encounter for hepatitis C screening test for low risk patient - Plan: Hepatitis C Antibody  Prostate cancer screening - Plan: PSA  General medical exam - Plan: CBC with Differential/Platelet, COMPLETE METABOLIC PANEL WITH GFR, Lipid panel, PSA, Hepatitis C Antibody  Need for prophylactic vaccination against Streptococcus pneumoniae (pneumococcus) - Plan: Pneumococcal polysaccharide vaccine 23-valent greater than or equal to 2yo subcutaneous/IM  Establishing care with new doctor,  encounter for  Obtain an x-ray of the right hand to evaluate the cause of his thumb pain.  Differential diagnosis includes de Quervain's tenosynovitis versus MCP joint arthritis.  Screen the patient for hepatitis C.  Screen the patient for prostate cancer with a PSA.  Meanwhile check fasting lab work including a CBC, CMP, fasting lipid panel,.  Patient received Pneumovax 23.  Await the results of his fasting lab work.

## 2017-07-03 LAB — COMPLETE METABOLIC PANEL WITH GFR
AG RATIO: 1.4 (calc) (ref 1.0–2.5)
ALT: 25 U/L (ref 9–46)
AST: 27 U/L (ref 10–35)
Albumin: 4.2 g/dL (ref 3.6–5.1)
Alkaline phosphatase (APISO): 122 U/L — ABNORMAL HIGH (ref 40–115)
BUN: 9 mg/dL (ref 7–25)
CO2: 28 mmol/L (ref 20–32)
Calcium: 9.2 mg/dL (ref 8.6–10.3)
Chloride: 105 mmol/L (ref 98–110)
Creat: 1.01 mg/dL (ref 0.70–1.25)
GFR, EST NON AFRICAN AMERICAN: 78 mL/min/{1.73_m2} (ref >=60)
GFR, Est African American: 90 mL/min/{1.73_m2} (ref >=60)
GLUCOSE: 85 mg/dL (ref 65–99)
Globulin: 2.9 g/dL (calc) (ref 1.9–3.7)
POTASSIUM: 4.2 mmol/L (ref 3.5–5.3)
Sodium: 139 mmol/L (ref 135–146)
Total Bilirubin: 0.4 mg/dL (ref 0.2–1.2)
Total Protein: 7.1 g/dL (ref 6.1–8.1)

## 2017-07-03 LAB — CBC WITH DIFFERENTIAL/PLATELET
Basophils Absolute: 51 cells/uL (ref 0–200)
Basophils Relative: 0.8 %
EOS PCT: 1.3 %
Eosinophils Absolute: 83 cells/uL (ref 15–500)
HCT: 39.7 % (ref 38.5–50.0)
Hemoglobin: 13.5 g/dL (ref 13.2–17.1)
Lymphs Abs: 3328 cells/uL (ref 850–3900)
MCH: 27.2 pg (ref 27.0–33.0)
MCHC: 34 g/dL (ref 32.0–36.0)
MCV: 80 fL (ref 80.0–100.0)
MONOS PCT: 7.8 %
MPV: 11.7 fL (ref 7.5–12.5)
NEUTROS PCT: 38.1 %
Neutro Abs: 2438 cells/uL (ref 1500–7800)
PLATELETS: 184 10*3/uL (ref 140–400)
RBC: 4.96 10*6/uL (ref 4.20–5.80)
RDW: 13.6 % (ref 11.0–15.0)
TOTAL LYMPHOCYTE: 52 %
WBC mixed population: 499 cells/uL (ref 200–950)
WBC: 6.4 10*3/uL (ref 3.8–10.8)

## 2017-07-03 LAB — LIPID PANEL
Cholesterol: 192 mg/dL (ref <200)
HDL: 61 mg/dL (ref >40)
LDL Cholesterol (Calc): 108 mg/dL (calc) — ABNORMAL HIGH
NON-HDL CHOLESTEROL (CALC): 131 mg/dL — AB (ref <130)
TRIGLYCERIDES: 118 mg/dL (ref <150)
Total CHOL/HDL Ratio: 3.1 (calc) (ref <5.0)

## 2017-07-03 LAB — HEPATITIS C ANTIBODY
Hepatitis C Ab: NONREACTIVE
SIGNAL TO CUT-OFF: 0.92 (ref <1.00)

## 2017-07-03 LAB — PSA: PSA: 1.1 ng/mL (ref <=4.0)

## 2017-07-04 ENCOUNTER — Encounter: Payer: Self-pay | Admitting: Family Medicine

## 2017-07-11 ENCOUNTER — Ambulatory Visit
Admission: RE | Admit: 2017-07-11 | Discharge: 2017-07-11 | Disposition: A | Discharge status: Home / Self Care | Payer: BC Managed Care – PPO | Source: Ambulatory Visit | Attending: Family Medicine | Admitting: Family Medicine

## 2017-07-11 DIAGNOSIS — M79644 Pain in right finger(s): Secondary | ICD-10-CM

## 2017-07-15 ENCOUNTER — Other Ambulatory Visit: Payer: Self-pay | Admitting: Family Medicine

## 2017-07-15 DIAGNOSIS — M79644 Pain in right finger(s): Secondary | ICD-10-CM

## 2017-07-15 DIAGNOSIS — M779 Enthesopathy, unspecified: Secondary | ICD-10-CM

## 2017-07-31 DIAGNOSIS — M1811 Unilateral primary osteoarthritis of first carpometacarpal joint, right hand: Secondary | ICD-10-CM | POA: Diagnosis not present

## 2017-08-01 DIAGNOSIS — M79644 Pain in right finger(s): Secondary | ICD-10-CM | POA: Diagnosis not present

## 2017-08-01 DIAGNOSIS — M1811 Unilateral primary osteoarthritis of first carpometacarpal joint, right hand: Secondary | ICD-10-CM | POA: Diagnosis not present

## 2018-07-04 ENCOUNTER — Other Ambulatory Visit: Payer: Self-pay

## 2018-07-04 ENCOUNTER — Ambulatory Visit (HOSPITAL_COMMUNITY)
Admission: EM | Admit: 2018-07-04 | Discharge: 2018-07-04 | Disposition: A | Discharge status: Home / Self Care | Payer: Medicare Other | Attending: Internal Medicine | Admitting: Internal Medicine

## 2018-07-04 DIAGNOSIS — I10 Essential (primary) hypertension: Secondary | ICD-10-CM | POA: Diagnosis not present

## 2018-07-04 DIAGNOSIS — H5789 Other specified disorders of eye and adnexa: Secondary | ICD-10-CM

## 2018-07-04 MED ORDER — ASPIRIN 81 MG PO CHEW
324.0000 mg | CHEWABLE_TABLET | Freq: Once | ORAL | Status: AC
  Administered 2018-07-04: 324 mg via ORAL

## 2018-07-04 MED ORDER — CLONIDINE HCL 0.1 MG PO TABS
0.2000 mg | ORAL_TABLET | ORAL | Status: AC
  Administered 2018-07-04: 0.2 mg via ORAL

## 2018-07-04 MED ORDER — ASPIRIN 81 MG PO CHEW
CHEWABLE_TABLET | ORAL | Status: AC
  Filled 2018-07-04: qty 4

## 2018-07-04 MED ORDER — CLONIDINE HCL 0.1 MG PO TABS
ORAL_TABLET | ORAL | Status: AC
  Filled 2018-07-04: qty 2

## 2018-07-04 NOTE — Discharge Instructions (Signed)
Please resume taking your blood pressure medication as well as a Bayer aspirin 81 mg.

## 2018-07-04 NOTE — ED Triage Notes (Signed)
Pt states for the last week hes had R sided eye burning with off and on facial numbness. Denies numbness at this time. No R eye redness, drainage.

## 2018-07-04 NOTE — ED Provider Notes (Signed)
[facility]    CSN: [id number] Arrival date & time: 07/04/18  [place]     History   Chief Complaint No chief complaint on file.   HPI Stephen Butler is a 68 y.o. male.   The patient with past medical history of hypertension presents to urgent care complaining of a feeling of debris in his right eye as well as occasional numbness around his right orbit.  The patient states the latter complaint has been present intermittently for nearly 2 weeks.  The sensation of "trash" in his eye has been there for a few days.  He states that rubbing his eye occasionally resolves the feeling of foreign body in his eye but that it soon returns.  Notably, the patient admits that his eye begins to feel numb more frequently when he has eaten a very salty meal.  The patient admits that he has not been taking his blood pressure medication regularly.     Past Medical History:  Diagnosis Date  . Epistaxis   . GERD (gastroesophageal reflux disease)   . Hematochezia   . Hypertension   . Microscopic hematuria     There are no active problems to display for this patient.   Past Surgical History:  Procedure Laterality Date  . SHOULDER SURGERY         Home Medications    Prior to Admission medications   Medication Sig Start Date End Date Taking? Authorizing Provider  amLODipine (NORVASC) 5 MG tablet Take 5 mg by mouth daily.    [provider]  sildenafil (VIAGRA) 100 MG tablet Take 0.5-1 tablets (50-100 mg total) by mouth daily as needed for erectile dysfunction. 07/02/17   Susy Frizzle, MD  traMADol-acetaminophen (ULTRACET) 37.5-325 MG tablet Take 2 tablets by mouth every 6 (six) hours as needed.    [provider]    Family History Family History  Problem Relation Age of Onset  . Hypertension Mother     Social History Social History   Tobacco Use  . Smoking status: Never Smoker  . Smokeless tobacco: Never Used  Substance Use Topics  . Alcohol use: No   . Drug use: No     Allergies   Patient has no known allergies.   Review of Systems Review of Systems  Constitutional: Negative for chills and fever.  HENT: Negative for sore throat and tinnitus.   Eyes: Negative for redness.  Respiratory: Negative for cough and shortness of breath.   Cardiovascular: Negative for chest pain and palpitations.  Gastrointestinal: Negative for abdominal pain, diarrhea, nausea and vomiting.  Genitourinary: Negative for dysuria, frequency and urgency.  Musculoskeletal: Negative for myalgias.  Skin: Negative for rash.       No lesions  Neurological: Positive for numbness (see HPI). Negative for facial asymmetry, speech difficulty and weakness.  Hematological: Does not bruise/bleed easily.  Psychiatric/Behavioral: Negative for suicidal ideas.     Physical Exam Triage Vital Signs ED Triage Vitals  Enc Vitals Group     BP 07/04/18 1905 (!) 180/103     Pulse Rate 07/04/18 1905 85     Resp 07/04/18 1905 16     Temp 07/04/18 1905 98 F (36.7 C)     Temp Source 07/04/18 1905 Temporal     SpO2 07/04/18 1905 100 %     Weight 07/04/18 1907 205 lb (93 kg)     Height 07/04/18 1907 5\' 9"  (1.753 m)     Head Circumference --  Peak Flow --      Pain Score 07/04/18 1907 0     Pain Loc --      Pain Edu? --      Excl. in [place]? --    No data found.  Updated Vital Signs BP (!) 162/93   Pulse 85   Temp 98 F (36.7 C) (Temporal)   Resp 16   Ht 5\' 9"  (1.753 m)   Wt 93 kg   SpO2 100%   BMI 30.27 kg/m   Visual Acuity Right Eye Distance:   Left Eye Distance:   Bilateral Distance:    Right Eye Near:   Left Eye Near:    Bilateral Near:     Physical Exam Constitutional:      General: He is not in acute distress.    Appearance: He is well-developed.  HENT:     Head: Normocephalic and atraumatic.  Eyes:     General: No scleral icterus.       Right eye: No foreign body.        Left eye: No foreign body.     Conjunctiva/sclera: Conjunctivae  normal.     Pupils: Pupils are equal, round, and reactive to light.     Funduscopic exam:    Right eye: AV nicking present. No hemorrhage, exudate or papilledema. Venous pulsations present.        Left eye: AV nicking present. No hemorrhage, exudate or papilledema. Venous pulsations present. Neck:     Musculoskeletal: Normal range of motion and neck supple.     Thyroid: No thyromegaly.     Vascular: No JVD.     Trachea: No tracheal deviation.  Cardiovascular:     Rate and Rhythm: Normal rate and regular rhythm.     Heart sounds: Normal heart sounds. No murmur. No friction rub. No gallop.   Pulmonary:     Effort: Pulmonary effort is normal. No respiratory distress.     Breath sounds: Normal breath sounds.  Abdominal:     General: Bowel sounds are normal. There is no distension.     Palpations: Abdomen is soft.     Tenderness: There is no abdominal tenderness.  Musculoskeletal: Normal range of motion.  Lymphadenopathy:     Cervical: No cervical adenopathy.  Skin:    General: Skin is warm and dry.     Findings: No erythema or rash.  Neurological:     Mental Status: He is alert and oriented to person, place, and time.     Cranial Nerves: No cranial nerve deficit.  Psychiatric:        Behavior: Behavior normal.        Thought Content: Thought content normal.        Judgment: Judgment normal.      UC Treatments / Results  Labs (all labs ordered are listed, but only abnormal results are displayed) Labs Reviewed - No data to display  EKG None  Radiology No results found.  Procedures Procedures (including critical care time)  Medications Ordered in UC Medications  cloNIDine (CATAPRES) tablet 0.2 mg (0.2 mg Oral Given 07/04/18 2004)  aspirin chewable tablet 324 mg (324 mg Oral Given 07/04/18 2004)    Initial Impression / Assessment and Plan / UC Course  I have reviewed the triage vital signs and the nursing notes.  Pertinent labs & imaging results that were available  during my care of the patient were reviewed by me and considered in my medical decision making (see chart for details).  No symptoms of amaurosis fugax or iritis.  Duration of symptoms is not consistent with prodrome of ocular herpes infection.  Eye irrigation attempted which resolved sensation of foreign body but no foreign body noted on eye exam.  Blood pressure very elevated upon arrival.  Given clonidine 0.2 mg in clinic which improved blood pressure.  Emphasized restarting blood pressure medication and taking baby aspirin as directed.  Instructed to go to the emergency department if he clearance is loss of vision, recurrent or worsening changes in sensation of his face, difficulty swallowing or speaking, or eruption of vesicular rash near eye.  Final Clinical Impressions(s) / UC Diagnoses   Final diagnoses:  Essential hypertension  Eye irritation     Discharge Instructions     Please resume taking your blood pressure medication as well as a Bayer aspirin 81 mg.    ED Prescriptions    None     Controlled Substance Prescriptions [place] Controlled Substance Registry consulted? Not Applicable   Harrie Foreman, MD 07/04/18 2031

## 2018-07-05 ENCOUNTER — Telehealth (HOSPITAL_COMMUNITY): Payer: Self-pay | Admitting: *Deleted

## 2018-07-05 NOTE — Telephone Encounter (Signed)
Per pharmacist: pt presents stating he was supposed to pick up HTN med Rx there from his visit at [facility] 07/04/18. Reviewed record and discussed with Dr Mannie Stabile; V.O. May refill pt's original amlodipine 5mg  PO QD, #30, no refills.  Pharmacist verbalized understanding.

## 2018-07-07 ENCOUNTER — Encounter: Payer: Self-pay | Admitting: Family Medicine

## 2018-07-07 ENCOUNTER — Ambulatory Visit (INDEPENDENT_AMBULATORY_CARE_PROVIDER_SITE_OTHER): Payer: Medicare Other | Admitting: Family Medicine

## 2018-07-07 ENCOUNTER — Other Ambulatory Visit: Payer: Self-pay

## 2018-07-07 VITALS — BP 160/80 | HR 78 | Temp 98.2°F | Resp 14 | Ht 68.0 in | Wt 212.0 lb

## 2018-07-07 DIAGNOSIS — G459 Transient cerebral ischemic attack, unspecified: Secondary | ICD-10-CM

## 2018-07-07 DIAGNOSIS — R079 Chest pain, unspecified: Secondary | ICD-10-CM | POA: Diagnosis not present

## 2018-07-07 MED ORDER — PREDNISONE 20 MG PO TABS: ORAL_TABLET | ORAL | 0 refills | Status: DC

## 2018-07-07 MED ORDER — AMLODIPINE BESYLATE 10 MG PO TABS: 10.0000 mg | ORAL_TABLET | Freq: Every day | ORAL | 3 refills | Status: DC

## 2018-07-07 MED ORDER — LISINOPRIL 20 MG PO TABS
20.0000 mg | ORAL_TABLET | Freq: Every day | ORAL | 3 refills | Status: DC
Start: 2018-07-07 — End: 2019-08-17

## 2018-07-07 NOTE — Progress Notes (Signed)
Subjective:    Patient ID: Stephen Butler, male    DOB: 24-Jun-1950, 68 y.o.   MRN: [id number]  HPI Patient recently was seen in an urgent care after he developed numbness on the right side of his face.  Was given clonidine at the urgent care due to significantly elevated blood pressure as well as aspirin.  Was instructed to follow-up with me.  He has not had any further numbness on the right side of his face since he started taking his blood pressure pill.  He is currently on amlodipine 5 mg a day although his blood pressure today is elevated at 160/80.  However prior to starting the blood pressure medication, the patient was having intermittent episodes of numbness on the right side of his face.  They would come and go without warning and spontaneously resolve.  He would also have a burning sensation in his right.  He denies any blurry vision.  He denies any headache.  He is here today complaining of chest pain.  The pain is located more in his left axilla.  It also is just inferior to his left scapula.  The pain is an aching sensation.  It tends to radiate from his superior left shoulder into his axilla and into his shoulder blade.  I am able to reproduce some of the pain with palpation of his left pectoralis muscle and also palpation of the paraspinal muscles along the left thoracic spine.  Pain sounds more muscular in nature.  EKG today shows normal sinus rhythm with normal intervals and a normal axis.  There are nonspecific ST changes in the inferior lead but no evidence of overt ischemia or infarction.  Pain is reproduced with range of motion in the left shoulder and also Spurling's maneuver in the neck.  Past Medical History:  Diagnosis Date  . Epistaxis   . GERD (gastroesophageal reflux disease)   . Hematochezia   . Hypertension   . Microscopic hematuria    Past Surgical History:  Procedure Laterality Date  . SHOULDER SURGERY     Current Outpatient Medications on File Prior to Visit   Medication Sig Dispense Refill  . amLODipine (NORVASC) 5 MG tablet Take 5 mg by mouth daily.    . sildenafil (VIAGRA) 100 MG tablet Take 0.5-1 tablets (50-100 mg total) by mouth daily as needed for erectile dysfunction. 5 tablet 11  . traMADol-acetaminophen (ULTRACET) 37.5-325 MG tablet Take 2 tablets by mouth every 6 (six) hours as needed.     Current Facility-Administered Medications on File Prior to Visit  Medication Dose Route Frequency Provider Last Rate Last Dose  . 0.9 %  sodium chloride infusion  500 mL Intravenous Continuous Armbruster, Carlota Raspberry, MD       No Known Allergies Social History   Socioeconomic History  . Marital status: Legally Separated    Spouse name: Not on file  . Number of children: Not on file  . Years of education: Not on file  . Highest education level: Not on file  Occupational History  . Not on file  Social Needs  . Financial resource strain: Not on file  . Food insecurity:    Worry: Not on file    Inability: Not on file  . Transportation needs:    Medical: Not on file    Non-medical: Not on file  Tobacco Use  . Smoking status: Never Smoker  . Smokeless tobacco: Never Used  Substance and Sexual Activity  . Alcohol use: No  .  Drug use: No  . Sexual activity: Not on file  Lifestyle  . Physical activity:    Days per week: Not on file    Minutes per session: Not on file  . Stress: Not on file  Relationships  . Social connections:    Talks on phone: Not on file    Gets together: Not on file    Attends religious service: Not on file    Active member of club or organization: Not on file    Attends meetings of clubs or organizations: Not on file    Relationship status: Not on file  . Intimate partner violence:    Fear of current or ex partner: Not on file    Emotionally abused: Not on file    Physically abused: Not on file    Forced sexual activity: Not on file  Other Topics Concern  . Not on file  Social History Narrative  . Not on file    Family History  Problem Relation Age of Onset  . Hypertension Mother      Review of Systems  All other systems reviewed and are negative.      Objective:   Physical Exam  Constitutional: He is oriented to person, place, and time. He appears well-developed and well-nourished. No distress.  Neck: Neck supple. No JVD present. No thyromegaly present.  Cardiovascular: Normal rate, regular rhythm and normal heart sounds. Exam reveals no gallop and no friction rub.  No murmur heard. Pulmonary/Chest: Effort normal and breath sounds normal. No respiratory distress. He has no wheezes. He has no rales. He exhibits no tenderness.  Abdominal: Soft. Bowel sounds are normal. He exhibits no distension. There is no abdominal tenderness. There is no rebound and no guarding.  Musculoskeletal:        General: No edema.       Back:       Arms:  Lymphadenopathy:    He has no cervical adenopathy.  Neurological: He is alert and oriented to person, place, and time. He has normal reflexes. No cranial nerve deficit. He exhibits normal muscle tone. Coordination normal.  Skin: He is not diaphoretic.  Vitals reviewed.         Assessment & Plan:  Chest pain, unspecified type - Plan: EKG 12-Lead, amLODipine (NORVASC) 10 MG tablet, lisinopril (PRINIVIL,ZESTRIL) 20 MG tablet, predniSONE (DELTASONE) 20 MG tablet, CBC with Differential/Platelet, COMPLETE METABOLIC PANEL WITH GFR  TIA (transient ischemic attack) - Plan: MR Brain W Wo Contrast, US Carotid Duplex Bilateral  Chest pain is very atypical.  I believe is more likely musculoskeletal in nature.  Given his positive Spurling maneuver and the radiation quality of the pain I believe is more likely neurologic in nature possibly due to a pinched nerve in his neck being radiated into the left shoulder area left axilla and down towards the left scapula.  I recommended trying a prednisone taper pack and then reassessing the patient next week.  I am concerned  however about his intermittent numbness in the right side of his face.  I am concerned this may be a possible TIA due to his elevated blood pressure.  Increase amlodipine to 10 mg a day and add lisinopril 20 mg a day.  Start aspirin 81 mg a day and recheck blood pressure next week.  Obtain carotid Dopplers to evaluate for carotid stenosis and also obtain an MRI of the brain.

## 2018-07-08 ENCOUNTER — Ambulatory Visit
Admission: RE | Admit: 2018-07-08 | Discharge: 2018-07-08 | Disposition: A | Discharge status: Home / Self Care | Payer: Medicare Other | Source: Ambulatory Visit | Attending: Family Medicine | Admitting: Family Medicine

## 2018-07-08 DIAGNOSIS — G459 Transient cerebral ischemic attack, unspecified: Secondary | ICD-10-CM | POA: Diagnosis not present

## 2018-07-08 LAB — CBC WITH DIFFERENTIAL/PLATELET
Absolute Monocytes: 443 cells/uL (ref 200–950)
Basophils Absolute: 30 cells/uL (ref 0–200)
Basophils Relative: 0.5 %
Eosinophils Absolute: 71 cells/uL (ref 15–500)
Eosinophils Relative: 1.2 %
HCT: 42.1 % (ref 38.5–50.0)
Hemoglobin: 14.1 g/dL (ref 13.2–17.1)
Lymphs Abs: 2726 cells/uL (ref 850–3900)
MCH: 27.2 pg (ref 27.0–33.0)
MCHC: 33.5 g/dL (ref 32.0–36.0)
MCV: 81.3 fL (ref 80.0–100.0)
MPV: 11.8 fL (ref 7.5–12.5)
Monocytes Relative: 7.5 %
Neutro Abs: 2631 cells/uL (ref 1500–7800)
Neutrophils Relative %: 44.6 %
Platelets: 192 10*3/uL (ref 140–400)
RBC: 5.18 10*6/uL (ref 4.20–5.80)
RDW: 14 % (ref 11.0–15.0)
Total Lymphocyte: 46.2 %
WBC: 5.9 10*3/uL (ref 3.8–10.8)

## 2018-07-08 LAB — COMPLETE METABOLIC PANEL WITH GFR
AG Ratio: 1.6 (calc) (ref 1.0–2.5)
ALT: 27 U/L (ref 9–46)
AST: 28 U/L (ref 10–35)
Albumin: 4.3 g/dL (ref 3.6–5.1)
Alkaline phosphatase (APISO): 117 U/L (ref 35–144)
BUN: 8 mg/dL (ref 7–25)
CO2: 29 mmol/L (ref 20–32)
Calcium: 9.5 mg/dL (ref 8.6–10.3)
Chloride: 105 mmol/L (ref 98–110)
Creat: 1.02 mg/dL (ref 0.70–1.25)
GFR, Est African American: 88 mL/min/{1.73_m2} (ref >=60)
GFR, Est Non African American: 76 mL/min/{1.73_m2} (ref >=60)
Globulin: 2.7 g/dL (calc) (ref 1.9–3.7)
Glucose, Bld: 112 mg/dL — ABNORMAL HIGH (ref 65–99)
Potassium: 4.3 mmol/L (ref 3.5–5.3)
Sodium: 141 mmol/L (ref 135–146)
Total Bilirubin: 0.4 mg/dL (ref 0.2–1.2)
Total Protein: 7 g/dL (ref 6.1–8.1)

## 2018-07-09 ENCOUNTER — Other Ambulatory Visit: Payer: Self-pay | Admitting: Family Medicine

## 2018-07-09 ENCOUNTER — Ambulatory Visit
Admission: RE | Admit: 2018-07-09 | Discharge: 2018-07-09 | Disposition: A | Discharge status: Home / Self Care | Payer: Medicare Other | Source: Ambulatory Visit | Attending: Family Medicine | Admitting: Family Medicine

## 2018-07-09 ENCOUNTER — Encounter: Payer: Self-pay | Admitting: Family Medicine

## 2018-07-09 ENCOUNTER — Other Ambulatory Visit: Payer: Self-pay

## 2018-07-09 DIAGNOSIS — G459 Transient cerebral ischemic attack, unspecified: Secondary | ICD-10-CM

## 2018-07-09 DIAGNOSIS — R42 Dizziness and giddiness: Secondary | ICD-10-CM | POA: Diagnosis not present

## 2018-07-09 MED ORDER — ROSUVASTATIN CALCIUM 20 MG PO TABS: 20.0000 mg | ORAL_TABLET | Freq: Every day | ORAL | 1 refills | Status: DC

## 2018-07-09 MED ORDER — GADOBENATE DIMEGLUMINE 529 MG/ML IV SOLN
20.0000 mL | Freq: Once | INTRAVENOUS | Status: AC | PRN
  Administered 2018-07-09: 20 mL via INTRAVENOUS

## 2018-07-14 ENCOUNTER — Other Ambulatory Visit: Payer: Self-pay

## 2018-07-14 ENCOUNTER — Encounter: Payer: Self-pay | Admitting: Family Medicine

## 2018-07-14 ENCOUNTER — Ambulatory Visit (INDEPENDENT_AMBULATORY_CARE_PROVIDER_SITE_OTHER): Payer: Medicare Other | Admitting: Family Medicine

## 2018-07-14 VITALS — BP 140/80 | HR 72 | Temp 98.1°F | Resp 16 | Ht 68.0 in | Wt 210.0 lb

## 2018-07-14 DIAGNOSIS — M5412 Radiculopathy, cervical region: Secondary | ICD-10-CM

## 2018-07-14 DIAGNOSIS — E7800 Pure hypercholesterolemia, unspecified: Secondary | ICD-10-CM | POA: Diagnosis not present

## 2018-07-14 DIAGNOSIS — G459 Transient cerebral ischemic attack, unspecified: Secondary | ICD-10-CM

## 2018-07-14 DIAGNOSIS — I10 Essential (primary) hypertension: Secondary | ICD-10-CM | POA: Diagnosis not present

## 2018-07-14 DIAGNOSIS — I6521 Occlusion and stenosis of right carotid artery: Secondary | ICD-10-CM

## 2018-07-14 NOTE — Progress Notes (Signed)
Subjective:    Patient ID: Stephen Butler, male    DOB: 08-26-1950, 68 y.o.   MRN: [id number]  HPI  07/07/18 Patient recently was seen in an urgent care after he developed numbness on the right side of his face.  Was given clonidine at the urgent care due to significantly elevated blood pressure as well as aspirin.  Was instructed to follow-up with me.  He has not had any further numbness on the right side of his face since he started taking his blood pressure pill.  He is currently on amlodipine 5 mg a day although his blood pressure today is elevated at 160/80.  However prior to starting the blood pressure medication, the patient was having intermittent episodes of numbness on the right side of his face.  They would come and go without warning and spontaneously resolve.  He would also have a burning sensation in his right.  He denies any blurry vision.  He denies any headache.  He is here today complaining of chest pain.  The pain is located more in his left axilla.  It also is just inferior to his left scapula.  The pain is an aching sensation.  It tends to radiate from his superior left shoulder into his axilla and into his shoulder blade.  I am able to reproduce some of the pain with palpation of his left pectoralis muscle and also palpation of the paraspinal muscles along the left thoracic spine.  Pain sounds more muscular in nature.  EKG today shows normal sinus rhythm with normal intervals and a normal axis.  There are nonspecific ST changes in the inferior lead but no evidence of overt ischemia or infarction.  Pain is reproduced with range of motion in the left shoulder and also Spurling's maneuver in the neck.  At that time, my plan was: Chest pain is very atypical.  I believe is more likely musculoskeletal in nature.  Given his positive Spurling maneuver and the radiation quality of the pain I believe is more likely neurologic in nature possibly due to a pinched nerve in his neck being radiated  into the left shoulder area left axilla and down towards the left scapula.  I recommended trying a prednisone taper pack and then reassessing the patient next week.  I am concerned however about his intermittent numbness in the right side of his face.  I am concerned this may be a possible TIA due to his elevated blood pressure.  Increase amlodipine to 10 mg a day and add lisinopril 20 mg a day.  Start aspirin 81 mg a day and recheck blood pressure next week.  Obtain carotid Dopplers to evaluate for carotid stenosis and also obtain an MRI of the brain.  Office Visit on 07/07/2018  Component Date Value Ref Range Status  . WBC 07/07/2018 5.9  3.8 - 10.8 Thousand/uL Final  . RBC 07/07/2018 5.18  4.20 - 5.80 Million/uL Final  . Hemoglobin 07/07/2018 14.1  13.2 - 17.1 g/dL Final  . HCT 07/07/2018 42.1  38.5 - 50.0 % Final  . MCV 07/07/2018 81.3  80.0 - 100.0 fL Final  . MCH 07/07/2018 27.2  27.0 - 33.0 pg Final  . MCHC 07/07/2018 33.5  32.0 - 36.0 g/dL Final  . RDW 07/07/2018 14.0  11.0 - 15.0 % Final  . Platelets 07/07/2018 192  140 - 400 Thousand/uL Final  . MPV 07/07/2018 11.8  7.5 - 12.5 fL Final  . Neutro Abs 07/07/2018 2,631  1,500 - 7,800  cells/uL Final  . Lymphs Abs 07/07/2018 2,726  850 - 3,900 cells/uL Final  . Absolute Monocytes 07/07/2018 443  200 - 950 cells/uL Final  . Eosinophils Absolute 07/07/2018 71  15 - 500 cells/uL Final  . Basophils Absolute 07/07/2018 30  0 - 200 cells/uL Final  . Neutrophils Relative % 07/07/2018 44.6  % Final  . Total Lymphocyte 07/07/2018 46.2  % Final  . Monocytes Relative 07/07/2018 7.5  % Final  . Eosinophils Relative 07/07/2018 1.2  % Final  . Basophils Relative 07/07/2018 0.5  % Final  . Glucose, Bld 07/07/2018 112* 65 - 99 mg/dL Final   Comment: .            Fasting reference interval . For someone without known diabetes, a glucose value between 100 and 125 mg/dL is consistent with prediabetes and should be confirmed with a follow-up test. .    . BUN 07/07/2018 8  7 - 25 mg/dL Final  . Creat 07/07/2018 1.02  0.70 - 1.25 mg/dL Final   Comment: For patients >59 years of age, the reference limit for Creatinine is approximately 13% higher for people identified as African-American. .   . GFR, Est Non African American 07/07/2018 76  > OR = 60 mL/min/1.16m2 Final  . GFR, Est African American 07/07/2018 88  > OR = 60 mL/min/1.14m2 Final  . BUN/Creatinine Ratio 44/04/270 NOT APPLICABLE  6 - 22 (calc) Final  . Sodium 07/07/2018 141  135 - 146 mmol/L Final  . Potassium 07/07/2018 4.3  3.5 - 5.3 mmol/L Final  . Chloride 07/07/2018 105  98 - 110 mmol/L Final  . CO2 07/07/2018 29  20 - 32 mmol/L Final  . Calcium 07/07/2018 9.5  8.6 - 10.3 mg/dL Final  . Total Protein 07/07/2018 7.0  6.1 - 8.1 g/dL Final  . Albumin 07/07/2018 4.3  3.6 - 5.1 g/dL Final  . Globulin 07/07/2018 2.7  1.9 - 3.7 g/dL (calc) Final  . AG Ratio 07/07/2018 1.6  1.0 - 2.5 (calc) Final  . Total Bilirubin 07/07/2018 0.4  0.2 - 1.2 mg/dL Final  . Alkaline phosphatase (APISO) 07/07/2018 117  35 - 144 U/L Final  . AST 07/07/2018 28  10 - 35 U/L Final  . ALT 07/07/2018 27  9 - 46 U/L Final    IMPRESSION: 1. Moderate amount right-sided atherosclerotic plaque results in elevated peak systolic velocities within the right internal carotid artery compatible with the 50-69% luminal narrowing range. Further evaluation with CTA could be performed as clinically indicated. 2. Large amount of left-sided atherosclerotic plaque, not definitely resulting in a hemodynamically significant stenosis.   IMPRESSION: Mild atrophy. Minor small vessel disease. Small chronic hemorrhagic lacunar infarct LEFT centrum semiovale.  07/14/18 Patient is here today for follow-up.  He is taking lisinopril 20 mg a day.  He also started Crestor 20 mg a day.  He is completed prednisone.  He did start aspirin after we receive the results of his MRI and carotid Dopplers.  However he is not taking  amlodipine.  There was a mixup at his pharmacy and he did not get this prescription.  I reviewed his MRI findings with him along with his carotid Doppler findings.  He does have a 69% blockage in the right internal carotid artery.  However the numbness was on the right side of his face.  Therefore it should be the contralateral carotid artery that would cause his symptoms.  Therefore this I believe represents asymptomatic internal carotid artery stenosis.  However given the severity, I would still recommend establishing the patient with a vascular surgeon to follow this with [place].  I spent more than 25 minutes today with the patient explaining the results of his findings.  I believe at the present time this needs to be managed medically.  He continues to report occasional pain radiating into his left shoulder and down his left arm.  He also has some numbness in the distribution of the ulnar nerve in his left forearm.  I believe this is a sign of cervical radiculopathy.  Symptoms improved while on prednisone however they are not resolved by any means  Past Medical History:  Diagnosis Date  . Epistaxis   . GERD (gastroesophageal reflux disease)   . Hematochezia   . Hypertension   . Microscopic hematuria   . Stenosis of right carotid artery greater than 50%    Past Surgical History:  Procedure Laterality Date  . SHOULDER SURGERY     Current Outpatient Medications on File Prior to Visit  Medication Sig Dispense Refill  . amLODipine (NORVASC) 10 MG tablet Take 1 tablet (10 mg total) by mouth daily. 90 tablet 3  . lisinopril (PRINIVIL,ZESTRIL) 20 MG tablet Take 1 tablet (20 mg total) by mouth daily. 90 tablet 3  . predniSONE (DELTASONE) 20 MG tablet 3 tabs poqday 1-2, 2 tabs poqday 3-4, 1 tab poqday 5-6 12 tablet 0  . rosuvastatin (CRESTOR) 20 MG tablet Take 1 tablet (20 mg total) by mouth daily. 90 tablet 1  . sildenafil (VIAGRA) 100 MG tablet Take 0.5-1 tablets (50-100 mg total) by mouth daily as  needed for erectile dysfunction. 5 tablet 11   Current Facility-Administered Medications on File Prior to Visit  Medication Dose Route Frequency Provider Last Rate Last Dose  . 0.9 %  sodium chloride infusion  500 mL Intravenous Continuous Armbruster, Carlota Raspberry, MD       No Known Allergies Social History   Socioeconomic History  . Marital status: Legally Separated    Spouse name: Not on file  . Number of children: Not on file  . Years of education: Not on file  . Highest education level: Not on file  Occupational History  . Not on file  Social Needs  . Financial resource strain: Not on file  . Food insecurity:    Worry: Not on file    Inability: Not on file  . Transportation needs:    Medical: Not on file    Non-medical: Not on file  Tobacco Use  . Smoking status: Never Smoker  . Smokeless tobacco: Never Used  Substance and Sexual Activity  . Alcohol use: No  . Drug use: No  . Sexual activity: Not on file  Lifestyle  . Physical activity:    Days per week: Not on file    Minutes per session: Not on file  . Stress: Not on file  Relationships  . Social connections:    Talks on phone: Not on file    Gets together: Not on file    Attends religious service: Not on file    Active member of club or organization: Not on file    Attends meetings of clubs or organizations: Not on file    Relationship status: Not on file  . Intimate partner violence:    Fear of current or ex partner: Not on file    Emotionally abused: Not on file    Physically abused: Not on file    Forced sexual activity: Not  on file  Other Topics Concern  . Not on file  Social History Narrative  . Not on file   Family History  Problem Relation Age of Onset  . Hypertension Mother      Review of Systems  All other systems reviewed and are negative.      Objective:   Physical Exam  Constitutional: He is oriented to person, place, and time. He appears well-developed and well-nourished. No  distress.  Neck: Neck supple. No JVD present. No thyromegaly present.  Cardiovascular: Normal rate, regular rhythm and normal heart sounds. Exam reveals no gallop and no friction rub.  No murmur heard. Pulmonary/Chest: Effort normal and breath sounds normal. No respiratory distress. He has no wheezes. He has no rales. He exhibits no tenderness.  Abdominal: Soft. Bowel sounds are normal. He exhibits no distension. There is no abdominal tenderness. There is no rebound and no guarding.  Musculoskeletal:        General: No edema.       Back:       Arms:  Lymphadenopathy:    He has no cervical adenopathy.  Neurological: He is alert and oriented to person, place, and time. He has normal reflexes. No cranial nerve deficit. He exhibits normal muscle tone. Coordination normal.  Skin: He is not diaphoretic.  Vitals reviewed.         Assessment & Plan:  TIA (transient ischemic attack)  Cervical radiculopathy  Benign essential HTN  Pure hypercholesterolemia  Asymptomatic carotid artery stenosis without infarction, right - Plan: Ambulatory referral to Vascular Surgery  Spent more than 25 minutes today with the patient reviewing his findings.  His carotid artery stenosis is asymptomatic as it is in the right internal carotid artery and his numbness in his face was on the right side.  I would suspect that it should be on the contralateral side if it was in fact do to the internal carotid artery stenosis found on his carotid Dopplers.  Therefore I will consult vascular surgery nonurgently to follow this with this.  However I believe his situation needs to be managed medically.  I strongly encouraged the patient to take aspirin 81 mg daily for prevention of stroke.  I have recommended that he take amlodipine and lisinopril daily to control his blood pressure.  I recommended he take Crestor 20 mg daily and recheck a fasting lipid panel in [redacted] weeks along with his blood pressure.  We will defer  management of his cervical radiculopathy at the present time until the current situation has stabilized and we have a long-term plan in place.  I will also like to see vascular surgery's input.

## 2018-07-31 ENCOUNTER — Ambulatory Visit (INDEPENDENT_AMBULATORY_CARE_PROVIDER_SITE_OTHER): Payer: Medicare Other | Admitting: Family Medicine

## 2018-07-31 ENCOUNTER — Encounter: Payer: Self-pay | Admitting: Family Medicine

## 2018-07-31 ENCOUNTER — Ambulatory Visit (INDEPENDENT_AMBULATORY_CARE_PROVIDER_SITE_OTHER): Payer: Self-pay | Admitting: Family Medicine

## 2018-07-31 ENCOUNTER — Other Ambulatory Visit: Payer: Self-pay

## 2018-07-31 VITALS — BP 158/90 | HR 66

## 2018-07-31 VITALS — BP 162/90 | HR 78 | Temp 98.6°F | Resp 16 | Ht 68.0 in | Wt 210.0 lb

## 2018-07-31 DIAGNOSIS — I6521 Occlusion and stenosis of right carotid artery: Secondary | ICD-10-CM

## 2018-07-31 DIAGNOSIS — I10 Essential (primary) hypertension: Secondary | ICD-10-CM

## 2018-07-31 MED ORDER — CLONIDINE HCL 0.1 MG PO TABS: 0.1000 mg | ORAL_TABLET | Freq: Two times a day (BID) | ORAL | 3 refills | Status: DC

## 2018-07-31 NOTE — Progress Notes (Signed)
Subjective:    Patient ID: Stephen Butler, male    DOB: 1951/01/23, 68 y.o.   MRN: [id number]  HPI  07/07/18 Patient recently was seen in an urgent care after he developed numbness on the right side of his face.  Was given clonidine at the urgent care due to significantly elevated blood pressure as well as aspirin.  Was instructed to follow-up with me.  He has not had any further numbness on the right side of his face since he started taking his blood pressure pill.  He is currently on amlodipine 5 mg a day although his blood pressure today is elevated at 160/80.  However prior to starting the blood pressure medication, the patient was having intermittent episodes of numbness on the right side of his face.  They would come and go without warning and spontaneously resolve.  He would also have a burning sensation in his right.  He denies any blurry vision.  He denies any headache.  He is here today complaining of chest pain.  The pain is located more in his left axilla.  It also is just inferior to his left scapula.  The pain is an aching sensation.  It tends to radiate from his superior left shoulder into his axilla and into his shoulder blade.  I am able to reproduce some of the pain with palpation of his left pectoralis muscle and also palpation of the paraspinal muscles along the left thoracic spine.  Pain sounds more muscular in nature.  EKG today shows normal sinus rhythm with normal intervals and a normal axis.  There are nonspecific ST changes in the inferior lead but no evidence of overt ischemia or infarction.  Pain is reproduced with range of motion in the left shoulder and also Spurling's maneuver in the neck.  At that time, my plan was: Chest pain is very atypical.  I believe is more likely musculoskeletal in nature.  Given his positive Spurling maneuver and the radiation quality of the pain I believe is more likely neurologic in nature possibly due to a pinched nerve in his neck being radiated  into the left shoulder area left axilla and down towards the left scapula.  I recommended trying a prednisone taper pack and then reassessing the patient next week.  I am concerned however about his intermittent numbness in the right side of his face.  I am concerned this may be a possible TIA due to his elevated blood pressure.  Increase amlodipine to 10 mg a day and add lisinopril 20 mg a day.  Start aspirin 81 mg a day and recheck blood pressure next week.  Obtain carotid Dopplers to evaluate for carotid stenosis and also obtain an MRI of the brain.  Office Visit on 07/07/2018  Component Date Value Ref Range Status  . WBC 07/07/2018 5.9  3.8 - 10.8 Thousand/uL Final  . RBC 07/07/2018 5.18  4.20 - 5.80 Million/uL Final  . Hemoglobin 07/07/2018 14.1  13.2 - 17.1 g/dL Final  . HCT 07/07/2018 42.1  38.5 - 50.0 % Final  . MCV 07/07/2018 81.3  80.0 - 100.0 fL Final  . MCH 07/07/2018 27.2  27.0 - 33.0 pg Final  . MCHC 07/07/2018 33.5  32.0 - 36.0 g/dL Final  . RDW 07/07/2018 14.0  11.0 - 15.0 % Final  . Platelets 07/07/2018 192  140 - 400 Thousand/uL Final  . MPV 07/07/2018 11.8  7.5 - 12.5 fL Final  . Neutro Abs 07/07/2018 2,631  1,500 - 7,800  cells/uL Final  . Lymphs Abs 07/07/2018 2,726  850 - 3,900 cells/uL Final  . Absolute Monocytes 07/07/2018 443  200 - 950 cells/uL Final  . Eosinophils Absolute 07/07/2018 71  15 - 500 cells/uL Final  . Basophils Absolute 07/07/2018 30  0 - 200 cells/uL Final  . Neutrophils Relative % 07/07/2018 44.6  % Final  . Total Lymphocyte 07/07/2018 46.2  % Final  . Monocytes Relative 07/07/2018 7.5  % Final  . Eosinophils Relative 07/07/2018 1.2  % Final  . Basophils Relative 07/07/2018 0.5  % Final  . Glucose, Bld 07/07/2018 112* 65 - 99 mg/dL Final   Comment: .            Fasting reference interval . For someone without known diabetes, a glucose value between 100 and 125 mg/dL is consistent with prediabetes and should be confirmed with a follow-up test. .    . BUN 07/07/2018 8  7 - 25 mg/dL Final  . Creat 07/07/2018 1.02  0.70 - 1.25 mg/dL Final   Comment: For patients >74 years of age, the reference limit for Creatinine is approximately 13% higher for people identified as African-American. .   . GFR, Est Non African American 07/07/2018 76  > OR = 60 mL/min/1.13m2 Final  . GFR, Est African American 07/07/2018 88  > OR = 60 mL/min/1.8m2 Final  . BUN/Creatinine Ratio 11/91/4782 NOT APPLICABLE  6 - 22 (calc) Final  . Sodium 07/07/2018 141  135 - 146 mmol/L Final  . Potassium 07/07/2018 4.3  3.5 - 5.3 mmol/L Final  . Chloride 07/07/2018 105  98 - 110 mmol/L Final  . CO2 07/07/2018 29  20 - 32 mmol/L Final  . Calcium 07/07/2018 9.5  8.6 - 10.3 mg/dL Final  . Total Protein 07/07/2018 7.0  6.1 - 8.1 g/dL Final  . Albumin 07/07/2018 4.3  3.6 - 5.1 g/dL Final  . Globulin 07/07/2018 2.7  1.9 - 3.7 g/dL (calc) Final  . AG Ratio 07/07/2018 1.6  1.0 - 2.5 (calc) Final  . Total Bilirubin 07/07/2018 0.4  0.2 - 1.2 mg/dL Final  . Alkaline phosphatase (APISO) 07/07/2018 117  35 - 144 U/L Final  . AST 07/07/2018 28  10 - 35 U/L Final  . ALT 07/07/2018 27  9 - 46 U/L Final    IMPRESSION: 1. Moderate amount right-sided atherosclerotic plaque results in elevated peak systolic velocities within the right internal carotid artery compatible with the 50-69% luminal narrowing range. Further evaluation with CTA could be performed as clinically indicated. 2. Large amount of left-sided atherosclerotic plaque, not definitely resulting in a hemodynamically significant stenosis.   IMPRESSION: Mild atrophy. Minor small vessel disease. Small chronic hemorrhagic lacunar infarct LEFT centrum semiovale.  07/14/18 Patient is here today for follow-up.  He is taking lisinopril 20 mg a day.  He also started Crestor 20 mg a day.  He is completed prednisone.  He did start aspirin after we receive the results of his MRI and carotid Dopplers.  However he is not taking  amlodipine.  There was a mixup at his pharmacy and he did not get this prescription.  I reviewed his MRI findings with him along with his carotid Doppler findings.  He does have a 69% blockage in the right internal carotid artery.  However the numbness was on the right side of his face.  Therefore it should be the contralateral carotid artery that would cause his symptoms.  Therefore this I believe represents asymptomatic internal carotid artery stenosis.  However given the severity, I would still recommend establishing the patient with a vascular surgeon to follow this with [place].  I spent more than 25 minutes today with the patient explaining the results of his findings.  I believe at the present time this needs to be managed medically.  He continues to report occasional pain radiating into his left shoulder and down his left arm.  He also has some numbness in the distribution of the ulnar nerve in his left forearm.  I believe this is a sign of cervical radiculopathy.  Symptoms improved while on prednisone however they are not resolved by any means.  At that time, my plan was: Spent more than 25 minutes today with the patient reviewing his findings.  His carotid artery stenosis is asymptomatic as it is in the right internal carotid artery and his numbness in his face was on the right side.  I would suspect that it should be on the contralateral side if it was in fact do to the internal carotid artery stenosis found on his carotid Dopplers.  Therefore I will consult vascular surgery nonurgently to follow this with this.  However I believe his situation needs to be managed medically.  I strongly encouraged the patient to take aspirin 81 mg daily for prevention of stroke.  I have recommended that he take amlodipine and lisinopril daily to control his blood pressure.  I recommended he take Crestor 20 mg daily and recheck a fasting lipid panel in [redacted] weeks along with his blood pressure.  We will defer management of his  cervical radiculopathy at the present time until the current situation has stabilized and we have a long-term plan in place.  I will also like to see vascular surgery's input.  07/31/18 Patient walked in this afternoon.  Despite taking amlodipine and lisinopril, his blood pressure continues to remain elevated in the 160s to 170s over 90.  I rechecked the patient's blood pressure myself and found to be 158/90.  He denies any chest pain shortness of breath or dyspnea on exertion.  He denies any headache or blurry vision.  Past Medical History:  Diagnosis Date  . Epistaxis   . GERD (gastroesophageal reflux disease)   . Hematochezia   . Hypertension   . Microscopic hematuria   . Stenosis of right carotid artery greater than 50%    Past Surgical History:  Procedure Laterality Date  . SHOULDER SURGERY     Current Outpatient Medications on File Prior to Visit  Medication Sig Dispense Refill  . amLODipine (NORVASC) 10 MG tablet Take 1 tablet (10 mg total) by mouth daily. 90 tablet 3  . lisinopril (PRINIVIL,ZESTRIL) 20 MG tablet Take 1 tablet (20 mg total) by mouth daily. 90 tablet 3  . rosuvastatin (CRESTOR) 20 MG tablet Take 1 tablet (20 mg total) by mouth daily. 90 tablet 1  . sildenafil (VIAGRA) 100 MG tablet Take 0.5-1 tablets (50-100 mg total) by mouth daily as needed for erectile dysfunction. 5 tablet 11   Current Facility-Administered Medications on File Prior to Visit  Medication Dose Route Frequency Provider Last Rate Last Dose  . 0.9 %  sodium chloride infusion  500 mL Intravenous Continuous Armbruster, Carlota Raspberry, MD       No Known Allergies Social History   Socioeconomic History  . Marital status: Legally Separated    Spouse name: Not on file  . Number of children: Not on file  . Years of education: Not on file  . Highest education  level: Not on file  Occupational History  . Not on file  Social Needs  . Financial resource strain: Not on file  . Food insecurity:    Worry:  Not on file    Inability: Not on file  . Transportation needs:    Medical: Not on file    Non-medical: Not on file  Tobacco Use  . Smoking status: Never Smoker  . Smokeless tobacco: Never Used  Substance and Sexual Activity  . Alcohol use: No  . Drug use: No  . Sexual activity: Not on file  Lifestyle  . Physical activity:    Days per week: Not on file    Minutes per session: Not on file  . Stress: Not on file  Relationships  . Social connections:    Talks on phone: Not on file    Gets together: Not on file    Attends religious service: Not on file    Active member of club or organization: Not on file    Attends meetings of clubs or organizations: Not on file    Relationship status: Not on file  . Intimate partner violence:    Fear of current or ex partner: Not on file    Emotionally abused: Not on file    Physically abused: Not on file    Forced sexual activity: Not on file  Other Topics Concern  . Not on file  Social History Narrative  . Not on file   Family History  Problem Relation Age of Onset  . Hypertension Mother      Review of Systems  All other systems reviewed and are negative.      Objective:   Physical Exam  Constitutional: He is oriented to person, place, and time. He appears well-developed and well-nourished. No distress.  Neck: Neck supple.  Cardiovascular: Normal rate, regular rhythm and normal heart sounds. Exam reveals no gallop and no friction rub.  No murmur heard. Pulmonary/Chest: Effort normal and breath sounds normal. No respiratory distress. He has no wheezes. He has no rales. He exhibits no tenderness.  Abdominal: Soft. Bowel sounds are normal.  Musculoskeletal:        General: No edema.  Neurological: He is alert and oriented to person, place, and time. He has normal reflexes. No cranial nerve deficit. He exhibits normal muscle tone. Coordination normal.  Skin: He is not diaphoretic.  Vitals reviewed.         Assessment &  Plan:  Benign essential HTN  We will add clonidine 0.1 mg p.o. twice daily to his current regimen and recheck his blood pressure in 1 week.  Increase medication as needed to achieve a goal blood pressure less than 140/90.

## 2018-08-01 ENCOUNTER — Ambulatory Visit: Payer: Medicare Other | Admitting: Family Medicine

## 2018-08-11 ENCOUNTER — Telehealth (HOSPITAL_COMMUNITY): Payer: Self-pay | Admitting: Rehabilitation

## 2018-08-11 NOTE — Telephone Encounter (Signed)
The above patient or their representative was contacted and gave the following answers to these questions:         Do you have any of the following symptoms? No  Fever                    Cough                   Shortness of breath  Do  you have any of the following other symptoms? No   muscle pain         vomiting,        diarrhea        rash         weakness        red eye        abdominal pain         bruising          bruising or bleeding              joint pain           severe headache    Have you been in contact with someone who was or has been sick in the past 2 weeks? No  Yes                 Unsure                         Unable to assess   Does the person that you were in contact with have any of the following symptoms?   Cough         shortness of breath           muscle pain         vomiting,            diarrhea            rash            weakness           fever            red eye           abdominal pain           bruising  or  bleeding                joint pain                severe headache               Have you  or someone you have been in contact with traveled internationally in th last month? No        If yes, which countries?   Have you  or someone you have been in contact with traveled outside [place] in th last month? No         If yes, which state and city?   COMMENTS OR ACTION PLAN FOR THIS PATIENT:          

## 2018-08-12 ENCOUNTER — Ambulatory Visit (INDEPENDENT_AMBULATORY_CARE_PROVIDER_SITE_OTHER): Payer: Medicare Other | Admitting: Vascular Surgery

## 2018-08-12 ENCOUNTER — Other Ambulatory Visit: Payer: Self-pay

## 2018-08-12 ENCOUNTER — Encounter: Payer: Self-pay | Admitting: Vascular Surgery

## 2018-08-12 DIAGNOSIS — I6521 Occlusion and stenosis of right carotid artery: Secondary | ICD-10-CM | POA: Diagnosis not present

## 2018-08-12 DIAGNOSIS — I6529 Occlusion and stenosis of unspecified carotid artery: Secondary | ICD-10-CM | POA: Insufficient documentation

## 2018-08-12 NOTE — Progress Notes (Signed)
Patient name: Stephen Butler MRN: [id number] DOB: 1951/03/21 Sex: male  REASON FOR CONSULT: Evaluate right carotid stenosis  HPI: Stephen Butler is a 68 y.o. male, with history of hypertension and GERD that presents for evaluation of right carotid stenosis.  Patient reports about 1-1.5 month of right sided facial numbness that is periodic.  He states these episodes last about 2-3 minutes, usually on the right side of his face, at least 50-70 of these episodes.  Almost every morning.  They usually occur in the morningwhenever he gets excited and is talking or whenever his blood pressure gets high.  He denies any vision loss.  He also denies any numbness or weakness in arm or leg.  He's had no dysarthria.  He presented to his primary care provider Dr. Dennard Schaumann who ultimately ordered an MRI of his brain as well as a ultrasound of his neck.  His MRI showed no evidence of right sided infarct, but he did have evidence of an old small chronic lacuanr infarct on left centrum semiovale.  His carotid duplex showed 50-69% stenosis on the right and plaque on the left with no hemodynamically significant stenosis. No previous neck surgery or neck radiation.  Denies tobacco abuse.  Past Medical History:  Diagnosis Date  . Epistaxis   . GERD (gastroesophageal reflux disease)   . Hematochezia   . Hypertension   . Microscopic hematuria   . Stenosis of right carotid artery greater than 50%     Past Surgical History:  Procedure Laterality Date  . SHOULDER SURGERY      Family History  Problem Relation Age of Onset  . Hypertension Mother   . Heart attack Mother   . Stroke Father     SOCIAL HISTORY: Social History   Socioeconomic History  . Marital status: Legally Separated    Spouse name: Not on file  . Number of children: Not on file  . Years of education: Not on file  . Highest education level: Not on file  Occupational History  . Not on file  Social Needs  . Financial resource strain: Not  on file  . Food insecurity:    Worry: Not on file    Inability: Not on file  . Transportation needs:    Medical: Not on file    Non-medical: Not on file  Tobacco Use  . Smoking status: Never Smoker  . Smokeless tobacco: Never Used  Substance and Sexual Activity  . Alcohol use: No  . Drug use: No  . Sexual activity: Not on file  Lifestyle  . Physical activity:    Days per week: Not on file    Minutes per session: Not on file  . Stress: Not on file  Relationships  . Social connections:    Talks on phone: Not on file    Gets together: Not on file    Attends religious service: Not on file    Active member of club or organization: Not on file    Attends meetings of clubs or organizations: Not on file    Relationship status: Not on file  . Intimate partner violence:    Fear of current or ex partner: Not on file    Emotionally abused: Not on file    Physically abused: Not on file    Forced sexual activity: Not on file  Other Topics Concern  . Not on file  Social History Narrative  . Not on file    No Known Allergies  Current Outpatient  Medications  Medication Sig Dispense Refill  . amLODipine (NORVASC) 10 MG tablet Take 1 tablet (10 mg total) by mouth daily. 90 tablet 3  . cloNIDine (CATAPRES) 0.1 MG tablet Take 1 tablet (0.1 mg total) by mouth 2 (two) times daily. 60 tablet 3  . lisinopril (PRINIVIL,ZESTRIL) 20 MG tablet Take 1 tablet (20 mg total) by mouth daily. 90 tablet 3  . rosuvastatin (CRESTOR) 20 MG tablet Take 1 tablet (20 mg total) by mouth daily. 90 tablet 1  . sildenafil (VIAGRA) 100 MG tablet Take 0.5-1 tablets (50-100 mg total) by mouth daily as needed for erectile dysfunction. (Patient not taking: Reported on 08/12/2018) 5 tablet 11   Current Facility-Administered Medications  Medication Dose Route Frequency Provider Last Rate Last Dose  . 0.9 %  sodium chloride infusion  500 mL Intravenous Continuous Armbruster, Carlota Raspberry, MD        REVIEW OF SYSTEMS:   [X]  denotes positive finding, [ ]  denotes negative finding Cardiac  Comments:  Chest pain or chest pressure:    Shortness of breath upon exertion:    Short of breath when lying flat:    Irregular heart rhythm:        Vascular    Pain in calf, thigh, or hip brought on by ambulation:    Pain in feet at night that wakes you up from your sleep:     Blood clot in your veins:    Leg swelling:         Pulmonary    Oxygen at home:    Productive cough:     Wheezing:         Neurologic    Sudden weakness in arms or legs:     Sudden numbness in arms or legs:     Sudden onset of difficulty speaking or slurred speech:    Temporary loss of vision in one eye:     Problems with dizziness:     Numbness right face x   Gastrointestinal    Blood in stool:     Vomited blood:         Genitourinary    Burning when urinating:     Blood in urine:        Psychiatric    Major depression:         Hematologic    Bleeding problems:    Problems with blood clotting too easily:        Skin    Rashes or ulcers:        Constitutional    Fever or chills:      PHYSICAL EXAM: Vitals:   08/12/18 0914 08/12/18 0920  BP: (!) 143/81 140/80  Pulse: (!) 56 60  Resp: 16   Temp: 98.5 F (36.9 C)   TempSrc: Oral   SpO2: 99%   Weight: 208 lb (94.3 kg)   Height: 5\' 6"  (1.676 m)     GENERAL: The patient is a well-nourished male, in no acute distress. The vital signs are documented above. CARDIAC: There is a regular rate and rhythm.  VASCULAR:  2+ carotid pulse bilateral No neck scars 2+ radial pulse palpable bilateral upper extremities PULMONARY: There is good air exchange bilaterally without wheezing or rales. ABDOMEN: Soft and non-tender with normal pitched bowel sounds.  MUSCULOSKELETAL: There are no major deformities or cyanosis. NEUROLOGIC: No focal weakness or paresthesias are detected.  CN II-XII grossly intact. SKIN: There are no ulcers or rashes noted. PSYCHIATRIC: The patient has a  normal  affect.  DATA:   MRI brain 07/09/2018: No acute intracranial findings, small chronic hemorrhagic lacunar infarct left centrum semiovale  Ultrasound carotid bilateral 07/08/2018: 50 to 60% right ICA stenosis with no significant left ICA stenosis even though there is left-sided atherosclerotic plaque  Assessment/Plan:  Discussed in detail with Stephen Butler that I do not think his symptoms of right facial numbness represent TIA.  He's had no vision loss, no dysarthria, no left-sided symptoms in arm or leg.  In addition with at least 50-70 episodes described over the past month, he had no acute findings on the MRI that would suggest atheroembolic etiology.  I think we treat his 60 to 69% stenosis of his right carotid as asymptomatic.  Discussed having him follow-up with me again in 6 months with a repeat caroitd duplex.  He is on aspirin and a statin and not smoking and appropriate medical management.  I discussed concerning findings including vision loss, dysarthria, unilateral numbness tingling weakness in arm or leg and discussed that if he has any new or progressive symptoms to let our office know immediately.  Discussed in further detail that we typically offer carotid intervention at greater than 80% stenosis in the setting of asymptomatic disease.  Look forward to seeing him in 6 months.   Marty Heck, MD [facility] Office: [phone] Pager: [phone]

## 2018-08-21 ENCOUNTER — Ambulatory Visit (INDEPENDENT_AMBULATORY_CARE_PROVIDER_SITE_OTHER): Payer: Medicare Other | Admitting: Family Medicine

## 2018-08-21 ENCOUNTER — Other Ambulatory Visit: Payer: Self-pay

## 2018-08-21 ENCOUNTER — Encounter: Payer: Self-pay | Admitting: Family Medicine

## 2018-08-21 VITALS — BP 125/75 | HR 60 | Temp 98.3°F | Resp 18 | Ht 66.0 in | Wt 212.6 lb

## 2018-08-21 DIAGNOSIS — R51 Headache: Secondary | ICD-10-CM | POA: Diagnosis not present

## 2018-08-21 DIAGNOSIS — R519 Headache, unspecified: Secondary | ICD-10-CM

## 2018-08-21 DIAGNOSIS — I10 Essential (primary) hypertension: Secondary | ICD-10-CM

## 2018-08-21 NOTE — Patient Instructions (Signed)
Take your blood pressure meds normally - do not take them in response to a headache.    Take tylenol [phone] mg every 6 hours as needed for head ache.  Can also try Excedrin migraine for headache.  Drink plenty of fluids, get enough sleep, watch caffeine intake.  For blood pressure, try to avoid high salt foods - check labels and look up nutritional content at restaurants.  See info below on low salt diet.  Goal for your blood pressure is going to be under 140/90, ideally under 130/80.   Your blood pressure is dynamic so it will vary and go up and down.  Record blood pressure when you are relaxed at home, not when stressed, in severe amount of pain or sick.    If you are feeling unwell you can check you blood pressure and I would notify us is over 180/100 so we can check on you.  If BP is over 180/100 and you have chest pain, shortness of breath, numbness, weakness, slurred speech - call 911 or go immediately to the ER.  Follow up with your PCP in 2 weeks - bring in BP log and your machine from home.  DASH Eating Plan DASH stands for "Dietary Approaches to Stop Hypertension." The DASH eating plan is a healthy eating plan that has been shown to reduce high blood pressure (hypertension). It may also reduce your risk for type 2 diabetes, heart disease, and stroke. The DASH eating plan may also help with weight loss. What are tips for following this plan?  General guidelines  Avoid eating more than 2,300 mg (milligrams) of salt (sodium) a day. If you have hypertension, you may need to reduce your sodium intake to 1,500 mg a day.  Limit alcohol intake to no more than 1 drink a day for nonpregnant women and 2 drinks a day for men. One drink equals 12 oz of beer, 5 oz of wine, or 1 oz of hard liquor.  Work with your health care provider to maintain a healthy body weight or to lose weight. Ask what an ideal weight is for you.  Get at least 30 minutes of exercise that causes your heart to  beat faster (aerobic exercise) most days of the week. Activities may include walking, swimming, or biking.  Work with your health care provider or diet and nutrition specialist (dietitian) to adjust your eating plan to your individual calorie needs. Reading food labels   Check food labels for the amount of sodium per serving. Choose foods with less than 5 percent of the Daily Value of sodium. Generally, foods with less than 300 mg of sodium per serving fit into this eating plan.  To find whole grains, look for the word "whole" as the first word in the ingredient list. Shopping  Buy products labeled as "low-sodium" or "no salt added."  Buy fresh foods. Avoid canned foods and premade or frozen meals. Cooking  Avoid adding salt when cooking. Use salt-free seasonings or herbs instead of table salt or sea salt. Check with your health care provider or pharmacist before using salt substitutes.  Do not fry foods. Cook foods using healthy methods such as baking, boiling, grilling, and broiling instead.  Cook with heart-healthy oils, such as olive, canola, soybean, or sunflower oil. Meal planning  Eat a balanced diet that includes: ? 5 or more servings of fruits and vegetables each day. At each meal, try to fill half of your plate with fruits and vegetables. ? Up to 6-8  servings of whole grains each day. ? Less than 6 oz of lean meat, poultry, or fish each day. A 3-oz serving of meat is about the same size as a deck of cards. One egg equals 1 oz. ? 2 servings of low-fat dairy each day. ? A serving of nuts, seeds, or beans 5 times each week. ? Heart-healthy fats. Healthy fats called Omega-3 fatty acids are found in foods such as flaxseeds and coldwater fish, like sardines, salmon, and mackerel.  Limit how much you eat of the following: ? Canned or prepackaged foods. ? Food that is high in trans fat, such as fried foods. ? Food that is high in saturated fat, such as fatty meat. ? Sweets,  desserts, sugary drinks, and other foods with added sugar. ? Full-fat dairy products.  Do not salt foods before eating.  Try to eat at least 2 vegetarian meals each week.  Eat more home-cooked food and less restaurant, buffet, and fast food.  When eating at a restaurant, ask that your food be prepared with less salt or no salt, if possible. What foods are recommended? The items listed may not be a complete list. Talk with your dietitian about what dietary choices are best for you. Grains Whole-grain or whole-wheat bread. Whole-grain or whole-wheat pasta. Brown rice. [place]. Bulgur. Whole-grain and low-sodium cereals. Pita bread. Low-fat, low-sodium crackers. Whole-wheat flour tortillas. Vegetables Fresh or frozen vegetables (raw, steamed, roasted, or grilled). Low-sodium or reduced-sodium tomato and vegetable juice. Low-sodium or reduced-sodium tomato sauce and tomato paste. Low-sodium or reduced-sodium canned vegetables. Fruits All fresh, dried, or frozen fruit. Canned fruit in natural juice (without added sugar). Meat and other protein foods Skinless chicken or [place]. Ground chicken or [place]. Pork with fat trimmed off. Fish and seafood. Egg whites. Dried beans, peas, or lentils. Unsalted nuts, nut butters, and seeds. Unsalted canned beans. Lean cuts of beef with fat trimmed off. Low-sodium, lean deli meat. Dairy Low-fat (1%) or fat-free (skim) milk. Fat-free, low-fat, or reduced-fat cheeses. Nonfat, low-sodium ricotta or cottage cheese. Low-fat or nonfat yogurt. Low-fat, low-sodium cheese. Fats and oils Soft margarine without trans fats. Vegetable oil. Low-fat, reduced-fat, or light mayonnaise and salad dressings (reduced-sodium). Canola, safflower, olive, soybean, and sunflower oils. Avocado. Seasoning and other foods Herbs. Spices. Seasoning mixes without salt. Unsalted popcorn and pretzels. Fat-free sweets. What foods are not recommended? The items listed may not be a  complete list. Talk with your dietitian about what dietary choices are best for you. Grains Baked goods made with fat, such as croissants, muffins, or some breads. Dry pasta or rice meal packs. Vegetables Creamed or fried vegetables. Vegetables in a cheese sauce. Regular canned vegetables (not low-sodium or reduced-sodium). Regular canned tomato sauce and paste (not low-sodium or reduced-sodium). Regular tomato and vegetable juice (not low-sodium or reduced-sodium). Angie Fava. Olives. Fruits Canned fruit in a light or heavy syrup. Fried fruit. Fruit in cream or butter sauce. Meat and other protein foods Fatty cuts of meat. Ribs. Fried meat. Berniece Salines. Sausage. Bologna and other processed lunch meats. Salami. Fatback. Hotdogs. Bratwurst. Salted nuts and seeds. Canned beans with added salt. Canned or smoked fish. Whole eggs or egg yolks. Chicken or [place] with skin. Dairy Whole or 2% milk, cream, and half-and-half. Whole or full-fat cream cheese. Whole-fat or sweetened yogurt. Full-fat cheese. Nondairy creamers. Whipped toppings. Processed cheese and cheese spreads. Fats and oils Butter. Stick margarine. Lard. Shortening. Ghee. Bacon fat. Tropical oils, such as coconut, palm kernel, or palm oil. Seasoning and other  foods Salted popcorn and pretzels. Onion salt, garlic salt, seasoned salt, table salt, and sea salt. Worcestershire sauce. Tartar sauce. Barbecue sauce. Teriyaki sauce. Soy sauce, including reduced-sodium. Steak sauce. Canned and packaged gravies. Fish sauce. Oyster sauce. Cocktail sauce. Horseradish that you find on the shelf. Ketchup. Mustard. Meat flavorings and tenderizers. Bouillon cubes. Hot sauce and Tabasco sauce. Premade or packaged marinades. Premade or packaged taco seasonings. Relishes. Regular salad dressings. Where to find more information:  National Heart, Lung, and [place]: https://wilson-eaton.com/  American Heart Association: www.heart.org Summary  The DASH eating plan is a  healthy eating plan that has been shown to reduce high blood pressure (hypertension). It may also reduce your risk for type 2 diabetes, heart disease, and stroke.  With the DASH eating plan, you should limit salt (sodium) intake to 2,300 mg a day. If you have hypertension, you may need to reduce your sodium intake to 1,500 mg a day.  When on the DASH eating plan, aim to eat more fresh fruits and vegetables, whole grains, lean proteins, low-fat dairy, and heart-healthy fats.  Work with your health care provider or diet and nutrition specialist (dietitian) to adjust your eating plan to your individual calorie needs. This information is not intended to replace advice given to you by your health care provider. Make sure you discuss any questions you have with your health care provider. Document Released: 03/22/2011 Document Revised: 03/26/2016 Document Reviewed: 03/26/2016 Elsevier Interactive Patient Education  2019 [employer].

## 2018-08-21 NOTE — Progress Notes (Signed)
Patient ID: Stephen Butler, male    DOB: 29-Oct-1950, 68 y.o.   MRN: [id number]  PCP: Susy Frizzle, MD  Chief Complaint  Patient presents with  . Hypertension    180/101 on 05/06,   . Migraine    hard headache, no meds    Subjective:   Stephen Butler is a 68 y.o. male, presents to clinic with CC of elevated BP and HA x 2 days BP elevated yesterday, noted to be 150-160 midday and then last night 180/101.  He reports taking meds as prescribed prior to this, but mentions, "maybe it was something I ate," stating he ate fried chicken and mashed potatoes (fast food) a day or two before.  When asked if those foods were out of the norm for him, he said he always eats food like that.  Last night he had gradual onset of HA, located to right sided temple radiating to and from right occiput, described as throbbing, severity of pain moderate.  He woke up at 4 am, took his BP meds to treat HA, has not taken anything else since.  HA still present, unchanged.  Denies associated visual disturbances, slurred speech, facial droop, focal weakness/numbness, near syncope, palpitations, orthopnea, PND, CP, SOB.  No N, V, URI sx, sinus pain/pressure, ear or eye pain. No hx of migraines.  Patient Active Problem List   Diagnosis Date Noted  . Carotid stenosis 08/12/2018     Prior to Admission medications   Medication Sig Start Date End Date Taking? Authorizing Provider  amLODipine (NORVASC) 10 MG tablet Take 1 tablet (10 mg total) by mouth daily. 07/07/18  Yes Susy Frizzle, MD  cloNIDine (CATAPRES) 0.1 MG tablet Take 1 tablet (0.1 mg total) by mouth 2 (two) times daily. 07/31/18  Yes Susy Frizzle, MD  lisinopril (PRINIVIL,ZESTRIL) 20 MG tablet Take 1 tablet (20 mg total) by mouth daily. 07/07/18  Yes Susy Frizzle, MD  rosuvastatin (CRESTOR) 20 MG tablet Take 1 tablet (20 mg total) by mouth daily. 07/09/18  Yes Susy Frizzle, MD  sildenafil (VIAGRA) 100 MG tablet Take 0.5-1 tablets (50-100  mg total) by mouth daily as needed for erectile dysfunction. 07/02/17  Yes Susy Frizzle, MD     No Known Allergies   Family History  Problem Relation Age of Onset  . Hypertension Mother   . Heart attack Mother   . Stroke Father      Social History   Socioeconomic History  . Marital status: Legally Separated    Spouse name: Not on file  . Number of children: Not on file  . Years of education: Not on file  . Highest education level: Not on file  Occupational History  . Not on file  Social Needs  . Financial resource strain: Not on file  . Food insecurity:    Worry: Not on file    Inability: Not on file  . Transportation needs:    Medical: Not on file    Non-medical: Not on file  Tobacco Use  . Smoking status: Never Smoker  . Smokeless tobacco: Never Used  Substance and Sexual Activity  . Alcohol use: No  . Drug use: No  . Sexual activity: Not on file  Lifestyle  . Physical activity:    Days per week: Not on file    Minutes per session: Not on file  . Stress: Not on file  Relationships  . Social connections:    Talks on phone: Not on  file    Gets together: Not on file    Attends religious service: Not on file    Active member of club or organization: Not on file    Attends meetings of clubs or organizations: Not on file    Relationship status: Not on file  . Intimate partner violence:    Fear of current or ex partner: Not on file    Emotionally abused: Not on file    Physically abused: Not on file    Forced sexual activity: Not on file  Other Topics Concern  . Not on file  Social History Narrative  . Not on file     Review of Systems  Constitutional: Negative.   HENT: Negative.   Eyes: Negative.   Respiratory: Negative.   Cardiovascular: Negative.   Gastrointestinal: Negative.   Endocrine: Negative.   Genitourinary: Negative.   Musculoskeletal: Negative.   Skin: Negative.   Allergic/Immunologic: Negative.   Neurological: Negative.    Hematological: Negative.   Psychiatric/Behavioral: Negative.   All other systems reviewed and are negative.      Objective:    Vitals:   08/21/18 1105 08/21/18 1130 08/21/18 1132  BP: 120/70 118/70 125/75  Pulse: 60    Resp: 18    Temp: 98.3 F (36.8 C)    SpO2: 98%    Weight: 212 lb 9.6 oz (96.4 kg)    Height: 5\' 6"  (1.676 m)        Physical Exam Vitals signs and nursing note reviewed.  Constitutional:      General: He is not in acute distress.    Appearance: Normal appearance. He is well-developed. He is obese. He is not ill-appearing, toxic-appearing or diaphoretic.  HENT:     Head: Normocephalic and atraumatic.     Jaw: There is normal jaw occlusion. No trismus.     Salivary Glands: Right salivary gland is not diffusely enlarged or tender. Left salivary gland is not diffusely enlarged or tender.     Right Ear: Hearing, tympanic membrane, ear canal and external ear normal. No middle ear effusion. No mastoid tenderness.     Left Ear: Hearing, tympanic membrane, ear canal and external ear normal.  No middle ear effusion. No mastoid tenderness.     Nose: Rhinorrhea present. No nasal deformity, septal deviation, mucosal edema or congestion.     Right Nostril: No occlusion.     Left Nostril: No occlusion.     Right Turbinates: Enlarged and swollen.     Left Turbinates: Enlarged and swollen.     Right Sinus: No maxillary sinus tenderness or frontal sinus tenderness.     Left Sinus: No maxillary sinus tenderness or frontal sinus tenderness.     Comments: Mild nasal mucosal erythema, no sinus ttp, no ttp to right temple    Mouth/Throat:     Mouth: Mucous membranes are moist.     Tongue: No lesions.     Pharynx: Oropharynx is clear. Uvula midline. No pharyngeal swelling, oropharyngeal exudate, posterior oropharyngeal erythema or uvula swelling.     Tonsils: No tonsillar exudate. 0 on the right. 0 on the left.  Eyes:     General: Lids are normal. No scleral icterus.        Right eye: No discharge.        Left eye: No discharge.     Extraocular Movements: Extraocular movements intact.     Right eye: Normal extraocular motion and no nystagmus.     Left eye: Normal extraocular motion  and no nystagmus.     Conjunctiva/sclera: Conjunctivae normal.     Pupils: Pupils are equal, round, and reactive to light.  Neck:     Musculoskeletal: Normal range of motion and neck supple.     Vascular: No JVD.     Trachea: Trachea and phonation normal. No tracheal deviation.  Cardiovascular:     Rate and Rhythm: Normal rate and regular rhythm.  No extrasystoles are present.    Pulses: Normal pulses.          Radial pulses are 2+ on the right side and 2+ on the left side.       Posterior tibial pulses are 2+ on the right side and 2+ on the left side.     Heart sounds: Normal heart sounds. No murmur. No friction rub. No gallop.   Pulmonary:     Effort: Pulmonary effort is normal.     Breath sounds: Normal breath sounds. No wheezing, rhonchi or rales.  Abdominal:     General: Bowel sounds are normal. There is no distension.     Palpations: Abdomen is soft.     Tenderness: There is no abdominal tenderness. There is no guarding or rebound.  Musculoskeletal: Normal range of motion.     Right lower leg: No edema.     Left lower leg: No edema.  Lymphadenopathy:     Head:     Right side of head: No submental, submandibular, tonsillar, preauricular, posterior auricular or occipital adenopathy.     Left side of head: No submental, submandibular, tonsillar, preauricular, posterior auricular or occipital adenopathy.  Skin:    General: Skin is warm and dry.     Capillary Refill: Capillary refill takes less than 2 seconds.     Coloration: Skin is not jaundiced or pale.     Findings: No erythema or rash.  Neurological:     General: No focal deficit present.     Mental Status: He is alert.     Cranial Nerves: Cranial nerves are intact.     Sensory: Sensation is intact.     Motor:  Motor function is intact.     Coordination: Coordination is intact.     Gait: Gait is intact. Gait normal.  Psychiatric:        Mood and Affect: Mood normal.        Speech: Speech normal.        Behavior: Behavior normal.        Thought Content: Thought content normal.           Assessment & Plan:      ICD-10-CM   1. Hypertension, unspecified type I10   2. Acute nonintractable headache, unspecified headache type R51     BP normal here today, no change to BP meds, although he has HA, does not seem related to BP.  Encouraged him to take BP meds as prescribed and not try to take differently to "treat headache" encouraged him to use tylenol and excedrine to try and tx HA.  Make sure he is eating, drinking and sleeping enough.  Monitor caffeine intake.  He did have some signs of possibly viral URI so HA may be related to that?  He had no neurological deficit, no concerning signs or sx/red flags for HA.  Encouraged him to f/up with his PCP in 2 weeks.  Given DASH diet, BP log, bring in log and BP cuff to appt to see if it is accurate.  ER precautions reviewed.  Pt  was well appearing, VSS, left clinic in good condition   Delsa Grana, PA-C 08/21/18 11:35 AM

## 2018-08-22 ENCOUNTER — Encounter: Payer: Self-pay | Admitting: Family Medicine

## 2018-08-25 ENCOUNTER — Other Ambulatory Visit: Payer: Self-pay

## 2018-08-25 ENCOUNTER — Other Ambulatory Visit: Payer: Medicare Other

## 2018-08-25 DIAGNOSIS — E7800 Pure hypercholesterolemia, unspecified: Secondary | ICD-10-CM

## 2018-08-25 LAB — LIPID PANEL
Cholesterol: 121 mg/dL (ref <200)
HDL: 54 mg/dL (ref >=40)
LDL Cholesterol (Calc): 51 mg/dL (calc)
Non-HDL Cholesterol (Calc): 67 mg/dL (calc) (ref <130)
Total CHOL/HDL Ratio: 2.2 (calc) (ref <5.0)
Triglycerides: 76 mg/dL (ref <150)

## 2018-08-26 ENCOUNTER — Other Ambulatory Visit: Payer: Medicare Other

## 2018-09-04 ENCOUNTER — Encounter: Payer: Self-pay | Admitting: Family Medicine

## 2018-09-04 ENCOUNTER — Ambulatory Visit (INDEPENDENT_AMBULATORY_CARE_PROVIDER_SITE_OTHER): Payer: Medicare Other | Admitting: Family Medicine

## 2018-09-04 ENCOUNTER — Other Ambulatory Visit: Payer: Self-pay

## 2018-09-04 VITALS — BP 130/68 | HR 68 | Temp 99.2°F | Resp 16 | Ht 68.0 in | Wt 210.0 lb

## 2018-09-04 DIAGNOSIS — I6521 Occlusion and stenosis of right carotid artery: Secondary | ICD-10-CM | POA: Diagnosis not present

## 2018-09-04 DIAGNOSIS — I10 Essential (primary) hypertension: Secondary | ICD-10-CM

## 2018-09-04 MED ORDER — SILDENAFIL CITRATE 100 MG PO TABS: 50.0000 mg | ORAL_TABLET | Freq: Every day | ORAL | 11 refills | Status: DC | PRN

## 2018-09-04 NOTE — Progress Notes (Signed)
Subjective:    Patient ID: Stephen Butler, male    DOB: 20-Sep-1950, 68 y.o.   MRN: [id number]  HPI  07/07/18 Patient recently was seen in an urgent care after he developed numbness on the right side of his face.  Was given clonidine at the urgent care due to significantly elevated blood pressure as well as aspirin.  Was instructed to follow-up with me.  He has not had any further numbness on the right side of his face since he started taking his blood pressure pill.  He is currently on amlodipine 5 mg a day although his blood pressure today is elevated at 160/80.  However prior to starting the blood pressure medication, the patient was having intermittent episodes of numbness on the right side of his face.  They would come and go without warning and spontaneously resolve.  He would also have a burning sensation in his right.  He denies any blurry vision.  He denies any headache.  He is here today complaining of chest pain.  The pain is located more in his left axilla.  It also is just inferior to his left scapula.  The pain is an aching sensation.  It tends to radiate from his superior left shoulder into his axilla and into his shoulder blade.  I am able to reproduce some of the pain with palpation of his left pectoralis muscle and also palpation of the paraspinal muscles along the left thoracic spine.  Pain sounds more muscular in nature.  EKG today shows normal sinus rhythm with normal intervals and a normal axis.  There are nonspecific ST changes in the inferior lead but no evidence of overt ischemia or infarction.  Pain is reproduced with range of motion in the left shoulder and also Spurling's maneuver in the neck.  At that time, my plan was: Chest pain is very atypical.  I believe is more likely musculoskeletal in nature.  Given his positive Spurling maneuver and the radiation quality of the pain I believe is more likely neurologic in nature possibly due to a pinched nerve in his neck being radiated  into the left shoulder area left axilla and down towards the left scapula.  I recommended trying a prednisone taper pack and then reassessing the patient next week.  I am concerned however about his intermittent numbness in the right side of his face.  I am concerned this may be a possible TIA due to his elevated blood pressure.  Increase amlodipine to 10 mg a day and add lisinopril 20 mg a day.  Start aspirin 81 mg a day and recheck blood pressure next week.  Obtain carotid Dopplers to evaluate for carotid stenosis and also obtain an MRI of the brain.  Lab on 08/25/2018  Component Date Value Ref Range Status  . Cholesterol 08/25/2018 121  <200 mg/dL Final  . HDL 08/25/2018 54  > OR = 40 mg/dL Final  . Triglycerides 08/25/2018 76  <150 mg/dL Final  . LDL Cholesterol (Calc) 08/25/2018 51  mg/dL (calc) Final   Comment: Reference range: <100 . Desirable range <100 mg/dL for primary prevention;   <70 mg/dL for patients with CHD or diabetic patients  with > or = 2 CHD risk factors. Marland Kitchen LDL-C is now calculated using the Martin-Hopkins  calculation, which is a validated novel method providing  better accuracy than the Friedewald equation in the  estimation of LDL-C.  Cresenciano Genre et al. Annamaria Helling. 3875;643(32): 2061-2068  (http://education.[facility].com/faq/FAQ164)   . Total CHOL/HDL Ratio  08/25/2018 2.2  <5.0 (calc) Final  . Non-HDL Cholesterol (Calc) 08/25/2018 67  <130 mg/dL (calc) Final   Comment: For patients with diabetes plus 1 major ASCVD risk  factor, treating to a non-HDL-C goal of <100 mg/dL  (LDL-C of <70 mg/dL) is considered a therapeutic  option.     IMPRESSION: 1. Moderate amount right-sided atherosclerotic plaque results in elevated peak systolic velocities within the right internal carotid artery compatible with the 50-69% luminal narrowing range. Further evaluation with CTA could be performed as clinically indicated. 2. Large amount of left-sided atherosclerotic plaque,  not definitely resulting in a hemodynamically significant stenosis.   IMPRESSION: Mild atrophy. Minor small vessel disease. Small chronic hemorrhagic lacunar infarct LEFT centrum semiovale.  07/14/18 Patient is here today for follow-up.  He is taking lisinopril 20 mg a day.  He also started Crestor 20 mg a day.  He is completed prednisone.  He did start aspirin after we receive the results of his MRI and carotid Dopplers.  However he is not taking amlodipine.  There was a mixup at his pharmacy and he did not get this prescription.  I reviewed his MRI findings with him along with his carotid Doppler findings.  He does have a 69% blockage in the right internal carotid artery.  However the numbness was on the right side of his face.  Therefore it should be the contralateral carotid artery that would cause his symptoms.  Therefore this I believe represents asymptomatic internal carotid artery stenosis.  However given the severity, I would still recommend establishing the patient with a vascular surgeon to follow this with [place].  I spent more than 25 minutes today with the patient explaining the results of his findings.  I believe at the present time this needs to be managed medically.  He continues to report occasional pain radiating into his left shoulder and down his left arm.  He also has some numbness in the distribution of the ulnar nerve in his left forearm.  I believe this is a sign of cervical radiculopathy.  Symptoms improved while on prednisone however they are not resolved by any means.  At that time, my plan was: Spent more than 25 minutes today with the patient reviewing his findings.  His carotid artery stenosis is asymptomatic as it is in the right internal carotid artery and his numbness in his face was on the right side.  I would suspect that it should be on the contralateral side if it was in fact do to the internal carotid artery stenosis found on his carotid Dopplers.  Therefore I will  consult vascular surgery nonurgently to follow this with this.  However I believe his situation needs to be managed medically.  I strongly encouraged the patient to take aspirin 81 mg daily for prevention of stroke.  I have recommended that he take amlodipine and lisinopril daily to control his blood pressure.  I recommended he take Crestor 20 mg daily and recheck a fasting lipid panel in [redacted] weeks along with his blood pressure.  We will defer management of his cervical radiculopathy at the present time until the current situation has stabilized and we have a long-term plan in place.  I will also like to see vascular surgery's input.  07/31/18 Patient walked in this afternoon.  Despite taking amlodipine and lisinopril, his blood pressure continues to remain elevated in the 160s to 170s over 90.  I rechecked the patient's blood pressure myself and found to be 158/90.  He  denies any chest pain shortness of breath or dyspnea on exertion.  He denies any headache or blurry vision.  At that time, my plan was: We will add clonidine 0.1 mg p.o. twice daily to his current regimen and recheck his blood pressure in 1 week.  Increase medication as needed to achieve a goal blood pressure less than 140/90.  09/04/18 Patient has been taking his clonidine but he is only been taking it once a day.  His blood pressure here is 130/68 and well controlled.  However he has been checking his blood pressure at home and finding systolic values between 725 and 160.  However these blood pressures are checked on a cuff that we have checked here at the clinic and found it to be an accurate.  Furthermore he is checking these blood pressures later in the day when he is supposed to be taking his second dose of clonidine and forgetting to do that.  He states that whenever he gets anxious or nervous he will feel a tingling sensation on the right side of his face similar to what he experienced in the past.  This usually occurs when his blood  pressure is high.  He also is getting a headache when his blood pressure is high per his cuff at home.  Past Medical History:  Diagnosis Date  . Epistaxis   . GERD (gastroesophageal reflux disease)   . Hematochezia   . Hypertension   . Microscopic hematuria   . Stenosis of right carotid artery greater than 50%    Past Surgical History:  Procedure Laterality Date  . SHOULDER SURGERY     Current Outpatient Medications on File Prior to Visit  Medication Sig Dispense Refill  . amLODipine (NORVASC) 10 MG tablet Take 1 tablet (10 mg total) by mouth daily. 90 tablet 3  . cloNIDine (CATAPRES) 0.1 MG tablet Take 1 tablet (0.1 mg total) by mouth 2 (two) times daily. 60 tablet 3  . lisinopril (PRINIVIL,ZESTRIL) 20 MG tablet Take 1 tablet (20 mg total) by mouth daily. 90 tablet 3  . rosuvastatin (CRESTOR) 20 MG tablet Take 1 tablet (20 mg total) by mouth daily. 90 tablet 1  . sildenafil (VIAGRA) 100 MG tablet Take 0.5-1 tablets (50-100 mg total) by mouth daily as needed for erectile dysfunction. 5 tablet 11   Current Facility-Administered Medications on File Prior to Visit  Medication Dose Route Frequency Provider Last Rate Last Dose  . 0.9 %  sodium chloride infusion  500 mL Intravenous Continuous Armbruster, Carlota Raspberry, MD       No Known Allergies Social History   Socioeconomic History  . Marital status: Legally Separated    Spouse name: Not on file  . Number of children: Not on file  . Years of education: Not on file  . Highest education level: Not on file  Occupational History  . Not on file  Social Needs  . Financial resource strain: Not on file  . Food insecurity:    Worry: Not on file    Inability: Not on file  . Transportation needs:    Medical: Not on file    Non-medical: Not on file  Tobacco Use  . Smoking status: Never Smoker  . Smokeless tobacco: Never Used  Substance and Sexual Activity  . Alcohol use: No  . Drug use: No  . Sexual activity: Not on file  Lifestyle   . Physical activity:    Days per week: Not on file    Minutes per  session: Not on file  . Stress: Not on file  Relationships  . Social connections:    Talks on phone: Not on file    Gets together: Not on file    Attends religious service: Not on file    Active member of club or organization: Not on file    Attends meetings of clubs or organizations: Not on file    Relationship status: Not on file  . Intimate partner violence:    Fear of current or ex partner: Not on file    Emotionally abused: Not on file    Physically abused: Not on file    Forced sexual activity: Not on file  Other Topics Concern  . Not on file  Social History Narrative  . Not on file   Family History  Problem Relation Age of Onset  . Hypertension Mother   . Heart attack Mother   . Stroke Father      Review of Systems  All other systems reviewed and are negative.      Objective:   Physical Exam  Constitutional: He is oriented to person, place, and time. He appears well-developed and well-nourished. No distress.  Neck: Neck supple.  Cardiovascular: Normal rate, regular rhythm and normal heart sounds. Exam reveals no gallop and no friction rub.  No murmur heard. Pulmonary/Chest: Effort normal and breath sounds normal. No respiratory distress. He has no wheezes. He has no rales. He exhibits no tenderness.  Abdominal: Soft. Bowel sounds are normal.  Musculoskeletal:        General: No edema.  Neurological: He is alert and oriented to person, place, and time. He has normal reflexes. No cranial nerve deficit. He exhibits normal muscle tone. Coordination normal.  Skin: He is not diaphoretic.  Vitals reviewed.         Assessment & Plan:  Hypertension, unspecified type  Blood pressure here is well controlled.  Cholesterol was just checked in early May and was outstanding.  I am concerned that his blood pressure may be high later in the day due to his failure to take clonidine in the afternoon.  I  recommended that he take clonidine twice a day.  I also recommended that he check his blood pressure in the morning and in the afternoon and at night and record those values.  I recommended that he use a different blood pressure cuff that may be more accurate.  I want him to bring those values to me in 1 week.  If the values are elevated in the early evening despite taking clonidine twice a day I would increase the clonidine to 3 times a day and consider switching to the transdermal patch to ensure compliance as I believe noncompliance plays a role in his elevated blood pressure.

## 2018-10-06 ENCOUNTER — Other Ambulatory Visit: Payer: Self-pay

## 2018-10-06 ENCOUNTER — Ambulatory Visit (INDEPENDENT_AMBULATORY_CARE_PROVIDER_SITE_OTHER): Payer: Medicare Other | Admitting: Family Medicine

## 2018-10-06 ENCOUNTER — Encounter: Payer: Self-pay | Admitting: Family Medicine

## 2018-10-06 VITALS — BP 132/78 | HR 60 | Temp 98.7°F | Resp 14 | Ht 68.0 in | Wt 212.0 lb

## 2018-10-06 DIAGNOSIS — G509 Disorder of trigeminal nerve, unspecified: Secondary | ICD-10-CM | POA: Diagnosis not present

## 2018-10-06 DIAGNOSIS — I6521 Occlusion and stenosis of right carotid artery: Secondary | ICD-10-CM | POA: Diagnosis not present

## 2018-10-06 DIAGNOSIS — M25562 Pain in left knee: Secondary | ICD-10-CM | POA: Diagnosis not present

## 2018-10-06 DIAGNOSIS — I10 Essential (primary) hypertension: Secondary | ICD-10-CM | POA: Diagnosis not present

## 2018-10-06 MED ORDER — HYDROCHLOROTHIAZIDE 25 MG PO TABS: 25.0000 mg | ORAL_TABLET | Freq: Every day | ORAL | 3 refills | Status: DC

## 2018-10-06 NOTE — Progress Notes (Signed)
Subjective:    Patient ID: Stephen Butler, male    DOB: [birth year], 68 y.o.   MRN: [id number]  HPI  07/07/18 Patient recently was seen in an urgent care after he developed numbness on the right side of his face.  Was given clonidine at the urgent care due to significantly elevated blood pressure as well as aspirin.  Was instructed to follow-up with me.  He has not had any further numbness on the right side of his face since he started taking his blood pressure pill.  He is currently on amlodipine 5 mg a day although his blood pressure today is elevated at 160/80.  However prior to starting the blood pressure medication, the patient was having intermittent episodes of numbness on the right side of his face.  They would come and go without warning and spontaneously resolve.  He would also have a burning sensation in his right.  He denies any blurry vision.  He denies any headache.  He is here today complaining of chest pain.  The pain is located more in his left axilla.  It also is just inferior to his left scapula.  The pain is an aching sensation.  It tends to radiate from his superior left shoulder into his axilla and into his shoulder blade.  I am able to reproduce some of the pain with palpation of his left pectoralis muscle and also palpation of the paraspinal muscles along the left thoracic spine.  Pain sounds more muscular in nature.  EKG today shows normal sinus rhythm with normal intervals and a normal axis.  There are nonspecific ST changes in the inferior lead but no evidence of overt ischemia or infarction.  Pain is reproduced with range of motion in the left shoulder and also Spurling's maneuver in the neck.  At that time, my plan was: Chest pain is very atypical.  I believe is more likely musculoskeletal in nature.  Given his positive Spurling maneuver and the radiation quality of the pain I believe is more likely neurologic in nature possibly due to a pinched nerve in his neck being radiated  into the left shoulder area left axilla and down towards the left scapula.  I recommended trying a prednisone taper pack and then reassessing the patient next week.  I am concerned however about his intermittent numbness in the right side of his face.  I am concerned this may be a possible TIA due to his elevated blood pressure.  Increase amlodipine to 10 mg a day and add lisinopril 20 mg a day.  Start aspirin 81 mg a day and recheck blood pressure next week.  Obtain carotid Dopplers to evaluate for carotid stenosis and also obtain an MRI of the brain.  IMPRESSION: 1. Moderate amount right-sided atherosclerotic plaque results in elevated peak systolic velocities within the right internal carotid artery compatible with the 50-69% luminal narrowing range. Further evaluation with CTA could be performed as clinically indicated. 2. Large amount of left-sided atherosclerotic plaque, not definitely resulting in a hemodynamically significant stenosis.   IMPRESSION: Mild atrophy. Minor small vessel disease. Small chronic hemorrhagic lacunar infarct LEFT centrum semiovale.  07/14/18 Patient is here today for follow-up.  He is taking lisinopril 20 mg a day.  He also started Crestor 20 mg a day.  He is completed prednisone.  He did start aspirin after we receive the results of his MRI and carotid Dopplers.  However he is not taking amlodipine.  There was a mixup at his pharmacy and  he did not get this prescription.  I reviewed his MRI findings with him along with his carotid Doppler findings.  He does have a 69% blockage in the right internal carotid artery.  However the numbness was on the right side of his face.  Therefore it should be the contralateral carotid artery that would cause his symptoms.  Therefore this I believe represents asymptomatic internal carotid artery stenosis.  However given the severity, I would still recommend establishing the patient with a vascular surgeon to follow this with [place].  I  spent more than 25 minutes today with the patient explaining the results of his findings.  I believe at the present time this needs to be managed medically.  He continues to report occasional pain radiating into his left shoulder and down his left arm.  He also has some numbness in the distribution of the ulnar nerve in his left forearm.  I believe this is a sign of cervical radiculopathy.  Symptoms improved while on prednisone however they are not resolved by any means.  At that time, my plan was: Spent more than 25 minutes today with the patient reviewing his findings.  His carotid artery stenosis is asymptomatic as it is in the right internal carotid artery and his numbness in his face was on the right side.  I would suspect that it should be on the contralateral side if it was in fact do to the internal carotid artery stenosis found on his carotid Dopplers.  Therefore I will consult vascular surgery nonurgently to follow this with this.  However I believe his situation needs to be managed medically.  I strongly encouraged the patient to take aspirin 81 mg daily for prevention of stroke.  I have recommended that he take amlodipine and lisinopril daily to control his blood pressure.  I recommended he take Crestor 20 mg daily and recheck a fasting lipid panel in [redacted] weeks along with his blood pressure.  We will defer management of his cervical radiculopathy at the present time until the current situation has stabilized and we have a long-term plan in place.  I will also like to see vascular surgery's input.  07/31/18 Patient walked in this afternoon.  Despite taking amlodipine and lisinopril, his blood pressure continues to remain elevated in the 160s to 170s over 90.  I rechecked the patient's blood pressure myself and found to be 158/90.  He denies any chest pain shortness of breath or dyspnea on exertion.  He denies any headache or blurry vision.  At that time, my plan was: We will add clonidine 0.1 mg p.o.  twice daily to his current regimen and recheck his blood pressure in 1 week.  Increase medication as needed to achieve a goal blood pressure less than 140/90.  09/04/18 Patient has been taking his clonidine but he is only been taking it once a day.  His blood pressure here is 130/68 and well controlled.  However he has been checking his blood pressure at home and finding systolic values between 580 and 160.  However these blood pressures are checked on a cuff that we have checked here at the clinic and found it to be an accurate.  Furthermore he is checking these blood pressures later in the day when he is supposed to be taking his second dose of clonidine and forgetting to do that.  He states that whenever he gets anxious or nervous he will feel a tingling sensation on the right side of his face similar  to what he experienced in the past.  This usually occurs when his blood pressure is high.  He also is getting a headache when his blood pressure is high per his cuff at home.  At that time, my plan was: Blood pressure here is well controlled.  Cholesterol was just checked in early May and was outstanding.  I am concerned that his blood pressure may be high later in the day due to his failure to take clonidine in the afternoon.  I recommended that he take clonidine twice a day.  I also recommended that he check his blood pressure in the morning and in the afternoon and at night and record those values.  I recommended that he use a different blood pressure cuff that may be more accurate.  I want him to bring those values to me in 1 week.  If the values are elevated in the early evening despite taking clonidine twice a day I would increase the clonidine to 3 times a day and consider switching to the transdermal patch to ensure compliance as I believe noncompliance plays a role in his elevated blood pressure.  10/06/18 Patient presents today with 3 separate concerns.  #1 his blood pressure remains elevated.  He is  checking his blood pressure at home and seeing an average between 140 and 160/70-90.  The majority around 914 systolic.  Second issue is numbness tingling and pain on the right side of his face.  Please see above for the work-up for this.  Although he does have a history of a small lacunar infarct in the left brain, this has only been happening for the last 2 to 3 months.  He reports numbness and tingling around in front of his right ear and also radiating into the right eye down the right jaw.  At times he feels a burning stinging sensation.  This seems to follow the distribution of the trigeminal nerve.  MRI showed no other abnormality.  #3 the patient complains of pain in his left knee.  The pain is located over the lateral joint line.  He states that the pain is not constant.  At times is fine.  However at other times depending on the way he twists or bends his leg he will feel sharp stabbing pain in the lateral joint line.  He feels like his knee may buckle at times.  This is only been going on now for approximately 1 to 2 weeks  Past Medical History:  Diagnosis Date   Epistaxis    GERD (gastroesophageal reflux disease)    Hematochezia    Hypertension    Microscopic hematuria    Stenosis of right carotid artery greater than 50%    Past Surgical History:  Procedure Laterality Date   SHOULDER SURGERY     Current Outpatient Medications on File Prior to Visit  Medication Sig Dispense Refill   amLODipine (NORVASC) 10 MG tablet Take 1 tablet (10 mg total) by mouth daily. 90 tablet 3   cloNIDine (CATAPRES) 0.1 MG tablet Take 1 tablet (0.1 mg total) by mouth 2 (two) times daily. 60 tablet 3   lisinopril (PRINIVIL,ZESTRIL) 20 MG tablet Take 1 tablet (20 mg total) by mouth daily. 90 tablet 3   rosuvastatin (CRESTOR) 20 MG tablet Take 1 tablet (20 mg total) by mouth daily. 90 tablet 1   sildenafil (VIAGRA) 100 MG tablet Take 0.5-1 tablets (50-100 mg total) by mouth daily as needed for  erectile dysfunction. 11 tablet 11  Current Facility-Administered Medications on File Prior to Visit  Medication Dose Route Frequency Provider Last Rate Last Dose   0.9 %  sodium chloride infusion  500 mL Intravenous Continuous Armbruster, Carlota Raspberry, MD       No Known Allergies Social History   Socioeconomic History   Marital status: Legally Separated    Spouse name: Not on file   Number of children: Not on file   Years of education: Not on file   Highest education level: Not on file  Occupational History   Not on file  Social Needs   Financial resource strain: Not on file   Food insecurity    Worry: Not on file    Inability: Not on file   Transportation needs    Medical: Not on file    Non-medical: Not on file  Tobacco Use   Smoking status: Never Smoker   Smokeless tobacco: Never Used  Substance and Sexual Activity   Alcohol use: No   Drug use: No   Sexual activity: Not on file  Lifestyle   Physical activity    Days per week: Not on file    Minutes per session: Not on file   Stress: Not on file  Relationships   Social connections    Talks on phone: Not on file    Gets together: Not on file    Attends religious service: Not on file    Active member of club or organization: Not on file    Attends meetings of clubs or organizations: Not on file    Relationship status: Not on file   Intimate partner violence    Fear of current or ex partner: Not on file    Emotionally abused: Not on file    Physically abused: Not on file    Forced sexual activity: Not on file  Other Topics Concern   Not on file  Social History Narrative   Not on file   Family History  Problem Relation Age of Onset   Hypertension Mother    Heart attack Mother    Stroke Father      Review of Systems  All other systems reviewed and are negative.      Objective:   Physical Exam  Constitutional: He is oriented to person, place, and time. He appears well-developed  and well-nourished. No distress.  Neck: Neck supple.  Cardiovascular: Normal rate, regular rhythm and normal heart sounds. Exam reveals no gallop and no friction rub.  No murmur heard. Pulmonary/Chest: Effort normal and breath sounds normal. No respiratory distress. He has no wheezes. He has no rales. He exhibits no tenderness.  Abdominal: Soft. Bowel sounds are normal.  Musculoskeletal:        General: No edema.     Left knee: He exhibits abnormal meniscus. He exhibits normal range of motion, no swelling, no LCL laxity and no MCL laxity. Tenderness found. Lateral joint line tenderness noted.  Neurological: He is alert and oriented to person, place, and time. He has normal reflexes. No cranial nerve deficit. He exhibits normal muscle tone. Coordination normal.  Skin: He is not diaphoretic.  Vitals reviewed.         Assessment & Plan:  1. Hypertension, unspecified type Add hydrochlorothiazide 25 mg a day and recheck blood pressure in 1 month.  2. Trigeminal nerve disease Given his MRI results and the history of the symptoms, I believe the patient is likely dealing with trigeminal neuralgia.  At the present time the symptoms are  not bad enough to warrant Tegretol or gabapentin.  We will monitor the patient clinically for the time being.  I offered the patient a neurology consultation however the patient declined this at the present time. 3. Acute pain of left knee I believe the patient may have a meniscal tear coupled with osteoarthritis.  I recommended wearing a hinged knee brace for the next 2 weeks and see how symptoms improve.  If not proceed with imaging of the left knee as well as possibly a cortisone shot if the pain worsens.

## 2019-02-04 DIAGNOSIS — M545 Low back pain: Secondary | ICD-10-CM | POA: Diagnosis not present

## 2019-02-04 DIAGNOSIS — I10 Essential (primary) hypertension: Secondary | ICD-10-CM | POA: Diagnosis not present

## 2019-04-03 ENCOUNTER — Other Ambulatory Visit: Payer: Self-pay

## 2019-04-03 ENCOUNTER — Encounter: Payer: Self-pay | Admitting: Family Medicine

## 2019-04-03 ENCOUNTER — Ambulatory Visit (INDEPENDENT_AMBULATORY_CARE_PROVIDER_SITE_OTHER): Payer: Medicare Other | Admitting: Family Medicine

## 2019-04-03 VITALS — BP 138/84 | HR 80 | Temp 98.2°F | Resp 12 | Ht 68.0 in | Wt 218.0 lb

## 2019-04-03 DIAGNOSIS — M7918 Myalgia, other site: Secondary | ICD-10-CM | POA: Diagnosis not present

## 2019-04-03 DIAGNOSIS — I6521 Occlusion and stenosis of right carotid artery: Secondary | ICD-10-CM | POA: Diagnosis not present

## 2019-04-03 DIAGNOSIS — K5900 Constipation, unspecified: Secondary | ICD-10-CM

## 2019-04-03 MED ORDER — NAPROXEN 500 MG PO TABS: 500.0000 mg | ORAL_TABLET | Freq: Two times a day (BID) | ORAL | 1 refills | Status: DC

## 2019-04-03 MED ORDER — TIZANIDINE HCL 4 MG PO TABS: 4.0000 mg | ORAL_TABLET | Freq: Four times a day (QID) | ORAL | 0 refills | Status: DC | PRN

## 2019-04-03 NOTE — Patient Instructions (Signed)
Take anti-inflammatory and muscle relaxer For constipation 1 miralax packet a day  F/U as needed

## 2019-04-03 NOTE — Progress Notes (Signed)
   Subjective:    Patient ID: Stephen Butler, male    DOB: 1950/11/26, 68 y.o.   MRN: [id number]  Patient presents for Back Pain (pain under ribs and in midback)   Pt here with bilat side pain, he played golf 1 week ago, he had not played  due to the colder weather.  He has had discomfort since then.  He has a soreness when he twists or turns a certain way.  He denies any tingling or numbness in the lower extremities denies any radiating pain.  He also noted that his bowels are moving a little more sluggish and he has been feeling a little constipated over this past week but no nausea or vomiting fever or URI symptoms accompanying. He is urinating normally.   Review Of Systems:  GEN- denies fatigue, fever, weight loss,weakness, recent illness HEENT- denies eye drainage, change in vision, nasal discharge, CVS- denies chest pain, palpitations RESP- denies SOB, cough, wheeze ABD- denies N/V, change in stools, abd pain GU- denies dysuria, hematuria, dribbling, incontinence MSK- denies joint pain,+ muscle aches, injury Neuro- denies headache, dizziness, syncope, seizure activity       Objective:    BP 138/84   Pulse 80   Temp 98.2 F (36.8 C) (Temporal)   Resp 12   Ht 5\' 8"  (1.727 m)   Wt 218 lb (98.9 kg)   SpO2 98%   BMI 33.15 kg/m  GEN- NAD, alert and oriented x3 HEENT- PERRL, EOMI, non injected sclera, pink conjunctiva,  CVS- RRR, no murmur RESP-CTAB ABD-NABS,soft,NT,ND Musculoskeletal spine nontender fair range of motion tender to palpation bilateral paraspinals and sidewalls below the ribs Negative straight leg raise fair range of motion of hips and knees Neuro-sensation grossly intact tone intact in lower extremities motor equal bilateral lower extremities Skin intact EXT- No edema Pulses- Radial, DP- 2+        Assessment & Plan:      Problem List Items Addressed This Visit    None    Visit Diagnoses    Musculoskeletal pain    -  Primary   Muscular strain  after playing golf.  We will place him on anti-inflammatory along with muscle relaxer Zanaflex.  This should improve with time.  No red flags on exam.   Constipation, unspecified constipation type       Given samples of MiraLAX powder he can take 1 packet a day ,increase water and fiber      Note: This dictation was prepared with Dragon dictation along with smaller phrase technology. Any transcriptional errors that result from this process are unintentional.

## 2019-04-16 ENCOUNTER — Other Ambulatory Visit: Payer: Self-pay

## 2019-04-16 DIAGNOSIS — Z20822 Contact with and (suspected) exposure to covid-19: Secondary | ICD-10-CM

## 2019-04-17 LAB — NOVEL CORONAVIRUS, NAA: SARS-CoV-2, NAA: NOT DETECTED

## 2019-04-23 ENCOUNTER — Other Ambulatory Visit: Payer: Self-pay | Admitting: Family Medicine

## 2019-06-19 ENCOUNTER — Ambulatory Visit: Payer: Self-pay

## 2019-06-25 ENCOUNTER — Ambulatory Visit: Payer: Medicare Other | Attending: Internal Medicine

## 2019-06-25 ENCOUNTER — Other Ambulatory Visit: Payer: Self-pay

## 2019-06-25 DIAGNOSIS — Z23 Encounter for immunization: Secondary | ICD-10-CM

## 2019-06-25 NOTE — Progress Notes (Signed)
   Covid-19 Vaccination Clinic  Name:  Stephen Butler    MRN: [id number] DOB: 1951/02/22  06/25/2019  Stephen Butler was observed post Covid-19 immunization for 15 minutes without incident. He was provided with Vaccine Information Sheet and instruction to access the V-Safe system.   Stephen Butler was instructed to call 911 with any severe reactions post vaccine: Marland Kitchen Difficulty breathing  . Swelling of face and throat  . A fast heartbeat  . A bad rash all over body  . Dizziness and weakness   Immunizations Administered    Name Date Dose VIS Date Route   Pfizer COVID-19 Vaccine 06/25/2019  8:59 AM 0.3 mL 03/27/2019 Intramuscular   Manufacturer: [place]   Lot: [id number]   [place]: [id number]

## 2019-07-02 ENCOUNTER — Ambulatory Visit: Payer: Medicare Other | Admitting: Family Medicine

## 2019-07-02 ENCOUNTER — Telehealth: Payer: Self-pay | Admitting: Family Medicine

## 2019-07-02 ENCOUNTER — Other Ambulatory Visit: Payer: Self-pay

## 2019-07-02 VITALS — BP 160/90

## 2019-07-02 DIAGNOSIS — I10 Essential (primary) hypertension: Secondary | ICD-10-CM

## 2019-07-02 MED ORDER — SILDENAFIL CITRATE 100 MG PO TABS: 50.0000 mg | ORAL_TABLET | Freq: Every day | ORAL | 11 refills | Status: DC | PRN

## 2019-07-02 NOTE — Telephone Encounter (Signed)
Noted appt scheduled already

## 2019-07-02 NOTE — Telephone Encounter (Signed)
Pt in today for a BP check - BP was 160/90. Pt states that it has been up and down. He would also like to have his paxil refilled along with Viagra. Viagra refilled. He had not been taking the Paxil and therefore I recommended he make a f/u apt with Dr. Dennard Schaumann to review that medication and go over his BP. He agreed and is coming in on 07/06/2019. Paxil was NOT refilled.

## 2019-07-06 ENCOUNTER — Encounter: Payer: Self-pay | Admitting: Family Medicine

## 2019-07-06 ENCOUNTER — Ambulatory Visit (INDEPENDENT_AMBULATORY_CARE_PROVIDER_SITE_OTHER): Payer: Medicare Other | Admitting: Family Medicine

## 2019-07-06 ENCOUNTER — Other Ambulatory Visit: Payer: Self-pay

## 2019-07-06 VITALS — BP 120/70 | HR 72 | Temp 96.8°F | Resp 16 | Ht 68.0 in | Wt 215.0 lb

## 2019-07-06 DIAGNOSIS — Z125 Encounter for screening for malignant neoplasm of prostate: Secondary | ICD-10-CM

## 2019-07-06 DIAGNOSIS — F524 Premature ejaculation: Secondary | ICD-10-CM | POA: Diagnosis not present

## 2019-07-06 DIAGNOSIS — I10 Essential (primary) hypertension: Secondary | ICD-10-CM | POA: Diagnosis not present

## 2019-07-06 DIAGNOSIS — I6521 Occlusion and stenosis of right carotid artery: Secondary | ICD-10-CM

## 2019-07-06 MED ORDER — PAROXETINE HCL 20 MG PO TABS: ORAL_TABLET | ORAL | 0 refills | Status: DC

## 2019-07-06 NOTE — Progress Notes (Signed)
Subjective:    Patient ID: Stephen Butler, male    DOB: 1950-09-19, 69 y.o.   MRN: [id number]  HPI   Patient is a 69 year old African-American male who is requesting paroxetine for premature ejaculation.  In the past, he would take 20 mg 1 hour prior to sex in order to prevent premature ejaculation.  I have never used this medication off label for this use however the patient states that it worked well for him in the past.  I did question the patient that he is taking Viagra for erectile dysfunction and I am concerned that the Paxil will likely exacerbate the erectile dysfunction.  I explained to the patient that the 2 medications seem contradictory.  However the patient states that he does not need the Viagra.  He states that usually he gets a very hard full erection.  His biggest issue now is that he climax is prematurely and he wants to "last longer".  However recently his blood pressure was extremely high because he stopped all of his medication.  2 weeks ago his blood pressure was 170/100.  I asked the patient why he stopped his blood pressure medication and he states that he was having a difficult time achieving an erection.  Again I pointed out the contradictory nature of his request and his recent medical history however the patient assures me that he needs the paroxetine for his premature ejaculation.  He has resumed his blood pressure medication 2 weeks ago and now his blood pressure is well controlled.  I will check a verified self.  He is long overdue for fasting lab work.  He is not fasting today however the patient seldom returns for lab work requested.  Past medical history significant for a 50% stenosis of the right carotid artery. Past Medical History:  Diagnosis Date  . Epistaxis   . GERD (gastroesophageal reflux disease)   . Hematochezia   . Hypertension   . Microscopic hematuria   . Stenosis of right carotid artery greater than 50%    Past Surgical History:  Procedure  Laterality Date  . SHOULDER SURGERY     Current Outpatient Medications on File Prior to Visit  Medication Sig Dispense Refill  . amLODipine (NORVASC) 10 MG tablet Take 1 tablet (10 mg total) by mouth daily. 90 tablet 3  . cloNIDine (CATAPRES) 0.1 MG tablet Take 1 tablet (0.1 mg total) by mouth 2 (two) times daily. 60 tablet 3  . hydrochlorothiazide (HYDRODIURIL) 25 MG tablet Take 1 tablet (25 mg total) by mouth daily. 90 tablet 3  . lisinopril (PRINIVIL,ZESTRIL) 20 MG tablet Take 1 tablet (20 mg total) by mouth daily. 90 tablet 3  . naproxen (NAPROSYN) 500 MG tablet Take 1 tablet (500 mg total) by mouth 2 (two) times daily with a meal. 30 tablet 1  . rosuvastatin (CRESTOR) 20 MG tablet Take 1 tablet by mouth daily 90 tablet 0  . sildenafil (VIAGRA) 100 MG tablet Take 0.5-1 tablets (50-100 mg total) by mouth daily as needed for erectile dysfunction. 11 tablet 11  . tiZANidine (ZANAFLEX) 4 MG tablet Take 1 tablet (4 mg total) by mouth every 6 (six) hours as needed for muscle spasms. 30 tablet 0   Current Facility-Administered Medications on File Prior to Visit  Medication Dose Route Frequency Provider Last Rate Last Admin  . 0.9 %  sodium chloride infusion  500 mL Intravenous Continuous Armbruster, Carlota Raspberry, MD        No Known Allergies Social History  Socioeconomic History  . Marital status: Legally Separated    Spouse name: Not on file  . Number of children: Not on file  . Years of education: Not on file  . Highest education level: Not on file  Occupational History  . Not on file  Tobacco Use  . Smoking status: Never Smoker  . Smokeless tobacco: Never Used  Substance and Sexual Activity  . Alcohol use: No  . Drug use: No  . Sexual activity: Not on file  Other Topics Concern  . Not on file  Social History Narrative  . Not on file   Social Determinants of Health   Financial Resource Strain:   . Difficulty of Paying Living Expenses:   Food Insecurity:   . Worried About  Charity fundraiser in the Last Year:   . Arboriculturist in the Last Year:   Transportation Needs:   . Film/video editor (Medical):   Marland Kitchen Lack of Transportation (Non-Medical):   Physical Activity:   . Days of Exercise per Week:   . Minutes of Exercise per Session:   Stress:   . Feeling of Stress :   Social Connections:   . Frequency of Communication with Friends and Family:   . Frequency of Social Gatherings with Friends and Family:   . Attends Religious Services:   . Active Member of Clubs or Organizations:   . Attends Archivist Meetings:   Marland Kitchen Marital Status:   Intimate Partner Violence:   . Fear of Current or Ex-Partner:   . Emotionally Abused:   Marland Kitchen Physically Abused:   . Sexually Abused:    Family History  Problem Relation Age of Onset  . Hypertension Mother   . Heart attack Mother   . Stroke Father      Review of Systems  All other systems reviewed and are negative.      Objective:   Physical Exam  Constitutional: He is oriented to person, place, and time. He appears well-developed and well-nourished. No distress.  Cardiovascular: Normal rate, regular rhythm and normal heart sounds. Exam reveals no gallop and no friction rub.  No murmur heard. Pulmonary/Chest: Effort normal and breath sounds normal. No respiratory distress. He has no wheezes. He has no rales. He exhibits no tenderness.  Abdominal: Soft. Bowel sounds are normal.  Musculoskeletal:        General: No edema.     Cervical back: Neck supple.  Neurological: He is alert and oriented to person, place, and time. He has normal reflexes. No cranial nerve deficit. He exhibits normal muscle tone. Coordination normal.  Skin: He is not diaphoretic.  Vitals reviewed.         Assessment & Plan:  Benign essential HTN - Plan: CBC with Differential/Platelet, COMPLETE METABOLIC PANEL WITH GFR, LDL Cholesterol, Direct  Prostate cancer screening - Plan: PSA  Premature ejaculation  Asymptomatic  carotid artery stenosis without infarction, right  Patient's blood pressure today is well controlled.  I will check a CBC, CMP, and a direct LDL cholesterol today we will have the patient present.  I will check a PSA to screen for prostate cancer as well.  His goal LDL cholesterol is less than 70.  Regarding his premature ejaculation, I am fine prescribing paroxetine 20 mg taken 1 hour prior to sexual activity.  However again I spent more than 20 minutes today with the patient explaining the somewhat contradictory nature of his request.  He has stopped medication due to erectile  dysfunction and has taken Viagra due to erectile dysfunction yet he is asking for medication that can cause and contribute to erectile dysfunction to help with premature ejaculation.  The patient understands the contradiction but he states that the majority of the time he is able to give a hard firm erection and then he has trouble with premature ejaculation.  Therefore I will honor the patient's wish I prescribed paroxetine as I see no contraindications.

## 2019-07-07 LAB — COMPLETE METABOLIC PANEL WITH GFR
AG Ratio: 1.3 (calc) (ref 1.0–2.5)
ALT: 21 U/L (ref 9–46)
AST: 23 U/L (ref 10–35)
Albumin: 3.9 g/dL (ref 3.6–5.1)
Alkaline phosphatase (APISO): 95 U/L (ref 35–144)
BUN: 7 mg/dL (ref 7–25)
CO2: 28 mmol/L (ref 20–32)
Calcium: 9.4 mg/dL (ref 8.6–10.3)
Chloride: 105 mmol/L (ref 98–110)
Creat: 1.09 mg/dL (ref 0.70–1.25)
GFR, Est African American: 81 mL/min/{1.73_m2} (ref >=60)
GFR, Est Non African American: 70 mL/min/{1.73_m2} (ref >=60)
Globulin: 2.9 g/dL (calc) (ref 1.9–3.7)
Glucose, Bld: 102 mg/dL — ABNORMAL HIGH (ref 65–99)
Potassium: 3.8 mmol/L (ref 3.5–5.3)
Sodium: 139 mmol/L (ref 135–146)
Total Bilirubin: 0.5 mg/dL (ref 0.2–1.2)
Total Protein: 6.8 g/dL (ref 6.1–8.1)

## 2019-07-07 LAB — CBC WITH DIFFERENTIAL/PLATELET
Absolute Monocytes: 563 cells/uL (ref 200–950)
Basophils Absolute: 34 cells/uL (ref 0–200)
Basophils Relative: 0.5 %
Eosinophils Absolute: 74 cells/uL (ref 15–500)
Eosinophils Relative: 1.1 %
HCT: 41.6 % (ref 38.5–50.0)
Hemoglobin: 13.3 g/dL (ref 13.2–17.1)
Lymphs Abs: 2921 cells/uL (ref 850–3900)
MCH: 26.2 pg — ABNORMAL LOW (ref 27.0–33.0)
MCHC: 32 g/dL (ref 32.0–36.0)
MCV: 82.1 fL (ref 80.0–100.0)
MPV: 11.9 fL (ref 7.5–12.5)
Monocytes Relative: 8.4 %
Neutro Abs: 3109 cells/uL (ref 1500–7800)
Neutrophils Relative %: 46.4 %
Platelets: 205 10*3/uL (ref 140–400)
RBC: 5.07 10*6/uL (ref 4.20–5.80)
RDW: 14 % (ref 11.0–15.0)
Total Lymphocyte: 43.6 %
WBC: 6.7 10*3/uL (ref 3.8–10.8)

## 2019-07-07 LAB — LDL CHOLESTEROL, DIRECT: Direct LDL: 63 mg/dL (ref <100)

## 2019-07-07 LAB — PSA: PSA: 1.2 ng/mL (ref <=4.0)

## 2019-07-08 ENCOUNTER — Encounter: Payer: Self-pay | Admitting: Family Medicine

## 2019-07-13 ENCOUNTER — Other Ambulatory Visit: Payer: Self-pay | Admitting: Family Medicine

## 2019-07-13 DIAGNOSIS — R079 Chest pain, unspecified: Secondary | ICD-10-CM

## 2019-07-20 ENCOUNTER — Ambulatory Visit: Payer: Medicare Other | Attending: Internal Medicine

## 2019-07-20 DIAGNOSIS — Z23 Encounter for immunization: Secondary | ICD-10-CM

## 2019-07-20 NOTE — Progress Notes (Signed)
   Cov[facility]-19 Vaccination [facility]  Name:  Stephen Butler    MRN: [id number] DOB: 1951/03/06  07/20/2019  [name] was observed post Covid-19 immunization for 15 minutes without incident. He was provided with Vaccine Information Sheet and instruction to access the V-Safe system.   [name] was instructed to call 911 with any severe reactions post vaccine: Marland Kitchen Difficulty breathing  . Swelling of face and throat  . A fast heartbeat  . A bad rash all over body  . Dizziness and weakness   Immunizations Administered    Name Date Dose VIS Date Route   Pfizer COVID-19 Vaccine 07/20/2019  8:56 AM 0.3 mL 03/27/2019 Intramuscular   Manufacturer: [place]   Lot: [id number]   [place]: [id number]

## 2019-08-17 ENCOUNTER — Other Ambulatory Visit: Payer: Self-pay | Admitting: Family Medicine

## 2019-08-17 DIAGNOSIS — R079 Chest pain, unspecified: Secondary | ICD-10-CM

## 2019-08-29 ENCOUNTER — Other Ambulatory Visit: Payer: Self-pay | Admitting: Family Medicine

## 2019-08-29 DIAGNOSIS — R079 Chest pain, unspecified: Secondary | ICD-10-CM

## 2019-10-02 ENCOUNTER — Other Ambulatory Visit: Payer: Self-pay

## 2019-10-02 ENCOUNTER — Encounter: Payer: Self-pay | Admitting: Family Medicine

## 2019-10-02 ENCOUNTER — Ambulatory Visit (INDEPENDENT_AMBULATORY_CARE_PROVIDER_SITE_OTHER): Payer: Medicare Other | Admitting: Family Medicine

## 2019-10-02 VITALS — BP 164/82 | HR 76 | Temp 98.2°F | Ht 68.0 in | Wt 216.0 lb

## 2019-10-02 DIAGNOSIS — R05 Cough: Secondary | ICD-10-CM | POA: Diagnosis not present

## 2019-10-02 DIAGNOSIS — T464X5A Adverse effect of angiotensin-converting-enzyme inhibitors, initial encounter: Secondary | ICD-10-CM

## 2019-10-02 DIAGNOSIS — I10 Essential (primary) hypertension: Secondary | ICD-10-CM

## 2019-10-02 DIAGNOSIS — G5603 Carpal tunnel syndrome, bilateral upper limbs: Secondary | ICD-10-CM

## 2019-10-02 DIAGNOSIS — R0989 Other specified symptoms and signs involving the circulatory and respiratory systems: Secondary | ICD-10-CM

## 2019-10-02 MED ORDER — LOSARTAN POTASSIUM 100 MG PO TABS: 100.0000 mg | ORAL_TABLET | Freq: Every day | ORAL | 3 refills | Status: DC

## 2019-10-02 MED ORDER — PANTOPRAZOLE SODIUM 40 MG PO TBEC: 40.0000 mg | DELAYED_RELEASE_TABLET | Freq: Every day | ORAL | 3 refills | Status: DC

## 2019-10-02 NOTE — Progress Notes (Signed)
Subjective:    Patient ID: Stephen Butler, male    DOB: Feb 22, 1951, 69 y.o.   MRN: [id number]  HPI    Patient presents today with several concerns.  #1, he reports dysphagia.  He states that occasionally he feels like he is getting choked in his upper esophagus when he tries to swallow spit or pills.  However he denies any dysphagia to meat such as steak or chicken.  He denies any odynophagia.  He denies any hemoptysis or nausea or vomiting.  He does report frequent reflux.  He also reports a tickle cough in the center of his throat that he has a difficult time tolerating.  This is been present for quite some time.  Of note he is on lisinopril.  He denies any change in his voice.  He denies any hoarseness.  He denies any difficulty swallowing food.  Second he reports numbness in both hands.  It occurs at night when he is trying to sleep.  He will wake up and then have to shake his hands to make the numbness go away.  He would then change positions and his other hand will become numb.  It primarily occurs at night.  Today on exam he has a positive Phalen sign but a negative Tinel's sign.  Third his blood pressure is elevated.  He has not been checking his blood pressure at home. Past Medical History:  Diagnosis Date   Epistaxis    GERD (gastroesophageal reflux disease)    Hematochezia    Hypertension    Microscopic hematuria    Stenosis of right carotid artery greater than 50%    Past Surgical History:  Procedure Laterality Date   SHOULDER SURGERY     Current Outpatient Medications on File Prior to Visit  Medication Sig Dispense Refill   amLODipine (NORVASC) 10 MG tablet Take 1 tablet by mouth once daily 30 tablet 0   cloNIDine (CATAPRES) 0.1 MG tablet Take 1 tablet (0.1 mg total) by mouth 2 (two) times daily. 60 tablet 3   hydrochlorothiazide (HYDRODIURIL) 25 MG tablet Take 1 tablet (25 mg total) by mouth daily. 90 tablet 3   lisinopril (ZESTRIL) 20 MG tablet Take 1 tablet by  mouth once daily 90 tablet 0   rosuvastatin (CRESTOR) 20 MG tablet Take 1 tablet by mouth daily 90 tablet 0   sildenafil (VIAGRA) 100 MG tablet Take 0.5-1 tablets (50-100 mg total) by mouth daily as needed for erectile dysfunction. 11 tablet 11   naproxen (NAPROSYN) 500 MG tablet Take 1 tablet (500 mg total) by mouth 2 (two) times daily with a meal. (Patient not taking: Reported on 10/02/2019) 30 tablet 1   PARoxetine (PAXIL) 20 MG tablet Take 1 pill 1 hour prior to sex as needed (Patient not taking: Reported on 10/02/2019) 30 tablet 0   Current Facility-Administered Medications on File Prior to Visit  Medication Dose Route Frequency Provider Last Rate Last Admin   0.9 %  sodium chloride infusion  500 mL Intravenous Continuous Armbruster, Carlota Raspberry, MD        No Known Allergies Social History   Socioeconomic History   Marital status: Legally Separated    Spouse name: Not on file   Number of children: Not on file   Years of education: Not on file   Highest education level: Not on file  Occupational History   Not on file  Tobacco Use   Smoking status: Never Smoker   Smokeless tobacco: Never Used  Substance and  Sexual Activity   Alcohol use: No   Drug use: No   Sexual activity: Not on file  Other Topics Concern   Not on file  Social History Narrative   Not on file   Social Determinants of Health   Financial Resource Strain:    Difficulty of Paying Living Expenses:   Food Insecurity:    Worried About Running Out of Food in the Last Year:    Arboriculturist in the Last Year:   Transportation Needs:    Film/video editor (Medical):    Lack of Transportation (Non-Medical):   Physical Activity:    Days of Exercise per Week:    Minutes of Exercise per Session:   Stress:    Feeling of Stress :   Social Connections:    Frequency of Communication with Friends and Family:    Frequency of Social Gatherings with Friends and Family:    Attends  Religious Services:    Active Member of Clubs or Organizations:    Attends Music therapist:    Marital Status:   Intimate Partner Violence:    Fear of Current or Ex-Partner:    Emotionally Abused:    Physically Abused:    Sexually Abused:    Family History  Problem Relation Age of Onset   Hypertension Mother    Heart attack Mother    Stroke Father      Review of Systems  All other systems reviewed and are negative.      Objective:   Physical Exam Vitals reviewed.  Constitutional:      General: He is not in acute distress.    Appearance: He is well-developed. He is not diaphoretic.  Cardiovascular:     Rate and Rhythm: Normal rate and regular rhythm.     Heart sounds: Normal heart sounds. No murmur heard.  No friction rub. No gallop.   Pulmonary:     Effort: Pulmonary effort is normal. No respiratory distress.     Breath sounds: Normal breath sounds. No wheezing or rales.  Chest:     Chest wall: No tenderness.  Abdominal:     General: Bowel sounds are normal.     Palpations: Abdomen is soft.  Musculoskeletal:     Right hand: No tenderness. Normal range of motion. Normal strength. Normal sensation.     Left hand: No tenderness. Normal range of motion. Normal strength. Normal sensation.     Cervical back: Neck supple.  Neurological:     Mental Status: He is alert and oriented to person, place, and time.     Cranial Nerves: No cranial nerve deficit.     Motor: No abnormal muscle tone.     Coordination: Coordination normal.     Deep Tendon Reflexes: Reflexes are normal and symmetric.           Assessment & Plan:  Benign essential HTN  Globus sensation  ACE-inhibitor cough  Carpal tunnel syndrome, bilateral  Discontinue lisinopril due to cough and replaced with losartan 100 mg a day.  Patient has I believe carpal tunnel syndrome.  I have offered the patient a cortisone injection in the wrist but he would like to see a hand  specialist which I will happily refer him for.  I believe the globus sensation in his throat is due to a combination of the ACE inhibitor and likely GERD.  I will start the patient on Protonix 40 mg a day and then recheck in 2 to 3 weeks.  If globus sensation persists, I would recommend endoscopy through GI.

## 2019-10-06 DIAGNOSIS — H35033 Hypertensive retinopathy, bilateral: Secondary | ICD-10-CM | POA: Diagnosis not present

## 2019-10-06 DIAGNOSIS — H524 Presbyopia: Secondary | ICD-10-CM | POA: Diagnosis not present

## 2019-10-06 DIAGNOSIS — H52223 Regular astigmatism, bilateral: Secondary | ICD-10-CM | POA: Diagnosis not present

## 2019-10-06 DIAGNOSIS — H2513 Age-related nuclear cataract, bilateral: Secondary | ICD-10-CM | POA: Diagnosis not present

## 2019-10-26 DIAGNOSIS — R200 Anesthesia of skin: Secondary | ICD-10-CM | POA: Insufficient documentation

## 2019-10-26 DIAGNOSIS — R202 Paresthesia of skin: Secondary | ICD-10-CM | POA: Diagnosis not present

## 2019-10-26 DIAGNOSIS — M180 Bilateral primary osteoarthritis of first carpometacarpal joints: Secondary | ICD-10-CM | POA: Diagnosis not present

## 2019-10-26 DIAGNOSIS — M7712 Lateral epicondylitis, left elbow: Secondary | ICD-10-CM | POA: Diagnosis not present

## 2019-10-29 ENCOUNTER — Other Ambulatory Visit: Payer: Self-pay | Admitting: Family Medicine

## 2019-10-29 DIAGNOSIS — M7712 Lateral epicondylitis, left elbow: Secondary | ICD-10-CM | POA: Diagnosis not present

## 2019-10-29 DIAGNOSIS — R202 Paresthesia of skin: Secondary | ICD-10-CM | POA: Diagnosis not present

## 2019-10-29 DIAGNOSIS — M25522 Pain in left elbow: Secondary | ICD-10-CM | POA: Diagnosis not present

## 2019-10-29 DIAGNOSIS — R200 Anesthesia of skin: Secondary | ICD-10-CM | POA: Diagnosis not present

## 2019-10-29 DIAGNOSIS — R079 Chest pain, unspecified: Secondary | ICD-10-CM

## 2019-11-03 DIAGNOSIS — R200 Anesthesia of skin: Secondary | ICD-10-CM | POA: Diagnosis not present

## 2019-11-03 DIAGNOSIS — M25522 Pain in left elbow: Secondary | ICD-10-CM | POA: Diagnosis not present

## 2019-11-03 DIAGNOSIS — M7712 Lateral epicondylitis, left elbow: Secondary | ICD-10-CM | POA: Diagnosis not present

## 2019-11-03 DIAGNOSIS — R202 Paresthesia of skin: Secondary | ICD-10-CM | POA: Diagnosis not present

## 2019-11-05 ENCOUNTER — Other Ambulatory Visit: Payer: Self-pay | Admitting: Family Medicine

## 2019-11-16 DIAGNOSIS — M25561 Pain in right knee: Secondary | ICD-10-CM | POA: Diagnosis not present

## 2019-12-02 DIAGNOSIS — R200 Anesthesia of skin: Secondary | ICD-10-CM | POA: Diagnosis not present

## 2019-12-02 DIAGNOSIS — G5603 Carpal tunnel syndrome, bilateral upper limbs: Secondary | ICD-10-CM | POA: Diagnosis not present

## 2019-12-02 DIAGNOSIS — M7712 Lateral epicondylitis, left elbow: Secondary | ICD-10-CM | POA: Diagnosis not present

## 2019-12-02 DIAGNOSIS — R202 Paresthesia of skin: Secondary | ICD-10-CM | POA: Diagnosis not present

## 2019-12-29 DIAGNOSIS — M25561 Pain in right knee: Secondary | ICD-10-CM | POA: Insufficient documentation

## 2020-03-11 ENCOUNTER — Other Ambulatory Visit: Payer: Self-pay | Admitting: Family Medicine

## 2020-04-21 DIAGNOSIS — M5459 Other low back pain: Secondary | ICD-10-CM | POA: Diagnosis not present

## 2020-05-17 DIAGNOSIS — M545 Low back pain, unspecified: Secondary | ICD-10-CM | POA: Diagnosis not present

## 2020-05-19 ENCOUNTER — Other Ambulatory Visit: Payer: Self-pay | Admitting: Family Medicine

## 2020-05-19 DIAGNOSIS — R079 Chest pain, unspecified: Secondary | ICD-10-CM

## 2020-05-26 DIAGNOSIS — M5459 Other low back pain: Secondary | ICD-10-CM | POA: Diagnosis not present

## 2020-05-31 DIAGNOSIS — M545 Low back pain, unspecified: Secondary | ICD-10-CM | POA: Diagnosis not present

## 2020-06-07 ENCOUNTER — Encounter: Payer: Medicare Other | Admitting: Family Medicine

## 2020-06-07 DIAGNOSIS — M5416 Radiculopathy, lumbar region: Secondary | ICD-10-CM | POA: Diagnosis not present

## 2020-06-13 ENCOUNTER — Ambulatory Visit (INDEPENDENT_AMBULATORY_CARE_PROVIDER_SITE_OTHER): Payer: Medicare Other | Admitting: Family Medicine

## 2020-06-13 ENCOUNTER — Other Ambulatory Visit: Payer: Self-pay

## 2020-06-13 ENCOUNTER — Encounter: Payer: Self-pay | Admitting: Family Medicine

## 2020-06-13 VITALS — BP 148/80 | HR 71 | Temp 97.7°F | Resp 16 | Ht 68.0 in | Wt 231.0 lb

## 2020-06-13 DIAGNOSIS — I10 Essential (primary) hypertension: Secondary | ICD-10-CM | POA: Diagnosis not present

## 2020-06-13 DIAGNOSIS — D369 Benign neoplasm, unspecified site: Secondary | ICD-10-CM | POA: Diagnosis not present

## 2020-06-13 DIAGNOSIS — N489 Disorder of penis, unspecified: Secondary | ICD-10-CM

## 2020-06-13 DIAGNOSIS — Z125 Encounter for screening for malignant neoplasm of prostate: Secondary | ICD-10-CM

## 2020-06-13 DIAGNOSIS — Z0001 Encounter for general adult medical examination with abnormal findings: Secondary | ICD-10-CM | POA: Diagnosis not present

## 2020-06-13 DIAGNOSIS — Z0000 Encounter for general adult medical examination without abnormal findings: Secondary | ICD-10-CM

## 2020-06-13 DIAGNOSIS — I6521 Occlusion and stenosis of right carotid artery: Secondary | ICD-10-CM

## 2020-06-13 DIAGNOSIS — Z23 Encounter for immunization: Secondary | ICD-10-CM | POA: Diagnosis not present

## 2020-06-13 NOTE — Addendum Note (Signed)
Addended by: Patrcia Dolly on: 06/13/2020 11:43 AM   Modules accepted: Orders

## 2020-06-13 NOTE — Addendum Note (Signed)
Addended by: Patrcia Dolly on: 06/13/2020 11:09 AM   Modules accepted: Orders

## 2020-06-13 NOTE — Progress Notes (Signed)
Subjective:    Patient ID: Stephen Butler, male    DOB: [birth year], 70 y.o.   MRN: [id number]  HPI   Patient is a very pleasant 70 year old African-American man who presents today for a complete physical exam.  He had a colonoscopy in 2019 and he was found to have 5 or 6 tubular adenomas.  They recommended repeat colonoscopy in 3 years which would be this year.  He is requesting that I schedule this for him.  He is also due for prostate cancer screening.  I reviewed his immunization records.  Has had Pneumovax 23 as well as his COVID shots.  He is due for Prevnar 13 which he agrees to receive today.  He is also due for the shingles vaccine.  His blood pressure is high however he is only been taking clonidine once a day.  He also may be missing medication occasionally.  He denies any falls, depression, or memory loss.  He does report painful pimple-like blisters that form on his penile shaft every 3 to 4 months.  He states he has had them for years.  They hurt and burn and go away after about a week.  There are none visible today however the history sounds concerning for HSV. Past Medical History:  Diagnosis Date  . Epistaxis   . GERD (gastroesophageal reflux disease)   . Hematochezia   . Hypertension   . Microscopic hematuria   . Stenosis of right carotid artery greater than 50%    Past Surgical History:  Procedure Laterality Date  . SHOULDER SURGERY     Current Outpatient Medications on File Prior to Visit  Medication Sig Dispense Refill  . amLODipine (NORVASC) 10 MG tablet Take 1 tablet by mouth once daily 30 tablet 0  . cloNIDine (CATAPRES) 0.1 MG tablet Take 1 tablet (0.1 mg total) by mouth 2 (two) times daily. 60 tablet 3  . hydrochlorothiazide (HYDRODIURIL) 25 MG tablet Take 1 tablet by mouth once daily 90 tablet 1  . lisinopril (ZESTRIL) 20 MG tablet Take 1 tablet by mouth once daily 90 tablet 0  . losartan (COZAAR) 100 MG tablet Take 1 tablet (100 mg total) by mouth daily. Stop  LISINOPRIL 90 tablet 3  . naproxen (NAPROSYN) 500 MG tablet Take 1 tablet (500 mg total) by mouth 2 (two) times daily with a meal. 30 tablet 1  . pantoprazole (PROTONIX) 40 MG tablet Take 1 tablet (40 mg total) by mouth daily. 30 tablet 3  . PARoxetine (PAXIL) 20 MG tablet Take 1 pill 1 hour prior to sex as needed 30 tablet 0  . rosuvastatin (CRESTOR) 20 MG tablet Take 1 tablet by mouth daily 90 tablet 3  . sildenafil (VIAGRA) 100 MG tablet Take 0.5-1 tablets (50-100 mg total) by mouth daily as needed for erectile dysfunction. 11 tablet 11   Current Facility-Administered Medications on File Prior to Visit  Medication Dose Route Frequency Provider Last Rate Last Admin  . 0.9 %  sodium chloride infusion  500 mL Intravenous Continuous Armbruster, Carlota Raspberry, MD        No Known Allergies Social History   Socioeconomic History  . Marital status: Legally Separated    Spouse name: Not on file  . Number of children: Not on file  . Years of education: Not on file  . Highest education level: Not on file  Occupational History  . Not on file  Tobacco Use  . Smoking status: Never Smoker  . Smokeless tobacco: Never Used  Substance and Sexual Activity  . Alcohol use: No  . Drug use: No  . Sexual activity: Not Currently  Other Topics Concern  . Not on file  Social History Narrative  . Not on file   Social Determinants of Health   Financial Resource Strain: Not on file  Food Insecurity: Not on file  Transportation Needs: Not on file  Physical Activity: Not on file  Stress: Not on file  Social Connections: Not on file  Intimate Partner Violence: Not on file   Family History  Problem Relation Age of Onset  . Hypertension Mother   . Heart attack Mother   . Stroke Father      Review of Systems  All other systems reviewed and are negative.      Objective:   Physical Exam Vitals reviewed.  Constitutional:      General: He is not in acute distress.    Appearance: Normal  appearance. He is well-developed and normal weight. He is not ill-appearing, toxic-appearing or diaphoretic.  HENT:     Head: Normocephalic and atraumatic.     Right Ear: Tympanic membrane and ear canal normal.     Left Ear: Tympanic membrane and ear canal normal.     Nose: Nose normal. No congestion or rhinorrhea.     Mouth/Throat:     Mouth: Mucous membranes are moist.     Pharynx: Oropharynx is clear. No oropharyngeal exudate or posterior oropharyngeal erythema.  Eyes:     Extraocular Movements: Extraocular movements intact.     Conjunctiva/sclera: Conjunctivae normal.     Pupils: Pupils are equal, round, and reactive to light.  Neck:     Vascular: No carotid bruit.  Cardiovascular:     Rate and Rhythm: Normal rate and regular rhythm.     Heart sounds: Normal heart sounds. No murmur heard. No friction rub. No gallop.   Pulmonary:     Effort: Pulmonary effort is normal. No respiratory distress.     Breath sounds: Normal breath sounds. No stridor. No wheezing, rhonchi or rales.  Chest:     Chest wall: No tenderness.  Abdominal:     General: Bowel sounds are normal. There is no distension.     Palpations: Abdomen is soft.     Tenderness: There is no abdominal tenderness. There is no guarding or rebound.  Genitourinary:    Penis: Normal.      Testes: Normal.  Musculoskeletal:        General: No swelling, tenderness, deformity or signs of injury.     Cervical back: Neck supple.     Right lower leg: No edema.     Left lower leg: No edema.  Lymphadenopathy:     Cervical: No cervical adenopathy.  Skin:    Coloration: Skin is not jaundiced.     Findings: No bruising, erythema, lesion or rash.  Neurological:     General: No focal deficit present.     Mental Status: He is alert and oriented to person, place, and time. Mental status is at baseline.     Cranial Nerves: No cranial nerve deficit.     Sensory: No sensory deficit.     Motor: No weakness or abnormal muscle tone.      Coordination: Coordination normal.     Gait: Gait normal.     Deep Tendon Reflexes: Reflexes are normal and symmetric.  Psychiatric:        Mood and Affect: Mood normal.        Behavior:  Behavior normal.        Thought Content: Thought content normal.        Judgment: Judgment normal.           Assessment & Plan:  Asymptomatic carotid artery stenosis without infarction, right - Plan: CBC with Differential/Platelet, COMPLETE METABOLIC PANEL WITH GFR, Lipid panel  Prostate cancer screening - Plan: PSA  Benign essential HTN - Plan: CBC with Differential/Platelet, COMPLETE METABOLIC PANEL WITH GFR, Lipid panel  General medical exam  Tubular adenoma - Plan: Ambulatory referral to Gastroenterology  Penile lesion - Plan: HSV(herpes smplx)abs-1+2(IgG+IgM)-bld  I will check serology for HSV 1 and 2 but I suspect herpes is the cause of his recurrent penile lesions.  I will consult GI for repeat colonoscopy.  I will screen the patient for prostate cancer with a PSA.  I encouraged him to take clonidine twice a day every day and check his blood pressure and notify me if systolic blood pressures greater than 140.  I will check CBC, CMP, lipid panel.  Given his history of carotid artery stenosis, I would like his LDL cholesterol to be below 70.  He is consistently taking rosuvastatin and his aspirin.  Recommended the shingles vaccine.  He received Prevnar 13 today.

## 2020-06-13 NOTE — Addendum Note (Signed)
Addended by: Patrcia Dolly on: 06/13/2020 12:13 PM   Modules accepted: Orders

## 2020-06-14 ENCOUNTER — Other Ambulatory Visit: Payer: Self-pay | Admitting: Family Medicine

## 2020-06-14 LAB — CBC WITH DIFFERENTIAL/PLATELET
Absolute Monocytes: 676 cells/uL (ref 200–950)
Basophils Absolute: 31 cells/uL (ref 0–200)
Basophils Relative: 0.5 %
Eosinophils Absolute: 68 cells/uL (ref 15–500)
Eosinophils Relative: 1.1 %
HCT: 40.8 % (ref 38.5–50.0)
Hemoglobin: 13.4 g/dL (ref 13.2–17.1)
Lymphs Abs: 2864 cells/uL (ref 850–3900)
MCH: 26.6 pg — ABNORMAL LOW (ref 27.0–33.0)
MCHC: 32.8 g/dL (ref 32.0–36.0)
MCV: 81 fL (ref 80.0–100.0)
MPV: 11 fL (ref 7.5–12.5)
Monocytes Relative: 10.9 %
Neutro Abs: 2561 cells/uL (ref 1500–7800)
Neutrophils Relative %: 41.3 %
Platelets: 198 10*3/uL (ref 140–400)
RBC: 5.04 10*6/uL (ref 4.20–5.80)
RDW: 14.4 % (ref 11.0–15.0)
Total Lymphocyte: 46.2 %
WBC: 6.2 10*3/uL (ref 3.8–10.8)

## 2020-06-14 LAB — COMPLETE METABOLIC PANEL WITH GFR
AG Ratio: 1.4 (calc) (ref 1.0–2.5)
ALT: 32 U/L (ref 9–46)
AST: 23 U/L (ref 10–35)
Albumin: 4 g/dL (ref 3.6–5.1)
Alkaline phosphatase (APISO): 130 U/L (ref 35–144)
BUN: 10 mg/dL (ref 7–25)
CO2: 27 mmol/L (ref 20–32)
Calcium: 8.9 mg/dL (ref 8.6–10.3)
Chloride: 106 mmol/L (ref 98–110)
Creat: 1.04 mg/dL (ref 0.70–1.25)
GFR, Est African American: 85 mL/min/{1.73_m2} (ref >=60)
GFR, Est Non African American: 73 mL/min/{1.73_m2} (ref >=60)
Globulin: 2.8 g/dL (calc) (ref 1.9–3.7)
Glucose, Bld: 109 mg/dL — ABNORMAL HIGH (ref 65–99)
Potassium: 3.9 mmol/L (ref 3.5–5.3)
Sodium: 139 mmol/L (ref 135–146)
Total Bilirubin: 0.4 mg/dL (ref 0.2–1.2)
Total Protein: 6.8 g/dL (ref 6.1–8.1)

## 2020-06-14 LAB — LIPID PANEL
Cholesterol: 137 mg/dL (ref <200)
HDL: 54 mg/dL (ref >=40)
LDL Cholesterol (Calc): 65 mg/dL (calc)
Non-HDL Cholesterol (Calc): 83 mg/dL (calc) (ref <130)
Total CHOL/HDL Ratio: 2.5 (calc) (ref <5.0)
Triglycerides: 99 mg/dL (ref <150)

## 2020-06-14 LAB — PSA: PSA: 1.05 ng/mL (ref <=4.0)

## 2020-06-14 LAB — HSV(HERPES SIMPLEX VRS) I + II AB-IGG
HAV 1 IGG,TYPE SPECIFIC AB: 51.6 index — ABNORMAL HIGH
HSV 2 IGG,TYPE SPECIFIC AB: 11 index — ABNORMAL HIGH

## 2020-06-15 ENCOUNTER — Telehealth: Payer: Self-pay

## 2020-06-15 ENCOUNTER — Other Ambulatory Visit: Payer: Self-pay | Admitting: Nurse Practitioner

## 2020-06-15 DIAGNOSIS — R768 Other specified abnormal immunological findings in serum: Secondary | ICD-10-CM

## 2020-06-15 MED ORDER — VALACYCLOVIR HCL 1 G PO TABS: 1000.0000 mg | ORAL_TABLET | Freq: Two times a day (BID) | ORAL | 1 refills | Status: DC

## 2020-06-16 ENCOUNTER — Other Ambulatory Visit: Payer: Self-pay | Admitting: *Deleted

## 2020-06-16 LAB — HSV 1/2 AB (IGM), IFA W/RFLX TITER
HSV 1 IgM Screen: NEGATIVE
HSV 2 IgM Screen: NEGATIVE

## 2020-06-24 ENCOUNTER — Encounter: Payer: Self-pay | Admitting: Gastroenterology

## 2020-07-13 ENCOUNTER — Encounter: Payer: Self-pay | Admitting: Gastroenterology

## 2020-07-18 ENCOUNTER — Other Ambulatory Visit: Payer: Self-pay | Admitting: Family Medicine

## 2020-07-18 DIAGNOSIS — R079 Chest pain, unspecified: Secondary | ICD-10-CM

## 2020-07-22 ENCOUNTER — Other Ambulatory Visit: Payer: Self-pay | Admitting: Family Medicine

## 2020-08-16 ENCOUNTER — Other Ambulatory Visit: Payer: Self-pay | Admitting: Family Medicine

## 2020-09-19 ENCOUNTER — Other Ambulatory Visit: Payer: Self-pay | Admitting: Family Medicine

## 2020-09-21 ENCOUNTER — Ambulatory Visit (AMBULATORY_SURGERY_CENTER): Payer: Self-pay | Admitting: *Deleted

## 2020-09-21 ENCOUNTER — Other Ambulatory Visit: Payer: Self-pay

## 2020-09-21 VITALS — Ht 68.0 in | Wt 210.0 lb

## 2020-09-21 DIAGNOSIS — Z86010 Personal history of colonic polyps: Secondary | ICD-10-CM

## 2020-09-21 MED ORDER — NA SULFATE-K SULFATE-MG SULF 17.5-3.13-1.6 GM/177ML PO SOLN: 1.0000 | Freq: Once | ORAL | 0 refills | Status: AC

## 2020-09-21 NOTE — Progress Notes (Signed)
No egg or soy allergy known to patient  No issues with past sedation with any surgeries or procedures Patient denies ever being told they had issues or difficulty with intubation  No FH of Malignant Hyperthermia No diet pills per patient No home 02 use per patient  No blood thinners per patient  Pt denies issues with constipation  No A fib or A flutter  EMMI video to pt or via MyChart  COVID 19 guidelines implemented in [facility] today with Pt and RN  Pt is fully vaccinated  for Covid    Discussed with pt there will be an out-of-pocket cost for prep and that varies from $0 to 70 dollars   Due to the COVID-19 pandemic we are asking patients to follow certain guidelines.  Pt aware of COVID protocols and [facility] guidelines   

## 2020-10-05 ENCOUNTER — Ambulatory Visit (AMBULATORY_SURGERY_CENTER): Payer: Medicare Other | Admitting: Gastroenterology

## 2020-10-05 ENCOUNTER — Encounter: Payer: Self-pay | Admitting: Gastroenterology

## 2020-10-05 ENCOUNTER — Other Ambulatory Visit: Payer: Self-pay

## 2020-10-05 VITALS — BP 141/83 | HR 58 | Temp 97.8°F | Resp 18 | Ht 68.0 in | Wt 210.0 lb

## 2020-10-05 DIAGNOSIS — Z86010 Personal history of colonic polyps: Secondary | ICD-10-CM | POA: Diagnosis not present

## 2020-10-05 DIAGNOSIS — D123 Benign neoplasm of transverse colon: Secondary | ICD-10-CM | POA: Diagnosis not present

## 2020-10-05 DIAGNOSIS — K6289 Other specified diseases of anus and rectum: Secondary | ICD-10-CM | POA: Diagnosis not present

## 2020-10-05 MED ORDER — SODIUM CHLORIDE 0.9 % IV SOLN: 500.0000 mL | Freq: Once | INTRAVENOUS | Status: DC

## 2020-10-05 NOTE — Patient Instructions (Signed)
Read all of the handouts given to you by your recovery room nurse.  Resume your previous medications.  YOU HAD AN ENDOSCOPIC PROCEDURE TODAY AT [facility]:   Refer to the procedure report that was given to you for any specific questions about what was found during the examination.  If the procedure report does not answer your questions, please call your gastroenterologist to clarify.  If you requested that your care partner not be given the details of your procedure findings, then the procedure report has been included in a sealed envelope for you to review at your convenience later.  YOU SHOULD EXPECT: Some feelings of bloating in the abdomen. Passage of more gas than usual.  Walking can help get rid of the air that was put into your GI tract during the procedure and reduce the bloating. If you had a lower endoscopy (such as a colonoscopy or flexible sigmoidoscopy) you may notice spotting of blood in your stool or on the toilet paper. If you underwent a bowel prep for your procedure, you may not have a normal bowel movement for a few days.  Please Note:  You might notice some irritation and congestion in your nose or some drainage.  This is from the oxygen used during your procedure.  There is no need for concern and it should clear up in a day or so.  SYMPTOMS TO REPORT IMMEDIATELY:  Following lower endoscopy (colonoscopy or flexible sigmoidoscopy):  Excessive amounts of blood in the stool  Significant tenderness or worsening of abdominal pains  Swelling of the abdomen that is new, acute  Fever of 100F or higher    For urgent or emergent issues, a gastroenterologist can be reached at any hour by calling [phone]. Do not use MyChart messaging for urgent concerns.    DIET:  We do recommend a small meal at first, but then you may proceed to your regular diet.  Drink plenty of fluids but you should avoid alcoholic beverages for 24 hours.  Try to increase the fiber in your  diet, and drink plenty of water.  ACTIVITY:  You should plan to take it easy for the rest of today and you should NOT DRIVE or use heavy machinery until tomorrow (because of the sedation medicines used during the test).    FOLLOW UP: Our staff will call the number listed on your records 48-72 hours following your procedure to check on you and address any questions or concerns that you may have regarding the information given to you following your procedure. If we do not reach you, we will leave a message.  We will attempt to reach you two times.  During this call, we will ask if you have developed any symptoms of COVID 19. If you develop any symptoms (ie: fever, flu-like symptoms, shortness of breath, cough etc.) before then, please call [phone].  If you test positive for Covid 19 in the 2 weeks post procedure, please call and report this information to [place].    If any biopsies were taken you will be contacted by phone or by letter within the next 1-3 weeks.  Please call us at [phone] if you have not heard about the biopsies in 3 weeks.    SIGNATURES/CONFIDENTIALITY: You and/or your care partner have signed paperwork which will be entered into your electronic medical record.  These signatures attest to the fact that that the information above on your After Visit Summary has been reviewed and is understood.  Full responsibility of the confidentiality of this discharge information lies with you and/or your care-partner.  

## 2020-10-05 NOTE — Progress Notes (Signed)
Called to room to assist during endoscopic procedure.  Patient ID and intended procedure confirmed with present staff. Received instructions for my participation in the procedure from the performing physician.  

## 2020-10-05 NOTE — Progress Notes (Signed)
pt tolerated well. VSS. awake and to recovery. Report given to RN.  

## 2020-10-05 NOTE — Progress Notes (Signed)
Pt's states no medical or surgical changes since previsit or office visit. 

## 2020-10-05 NOTE — Op Note (Signed)
[facility] Patient Name: Stephen Butler Procedure Date: 10/05/2020 10:36 AM MRN: [id number] Endoscopist: Remo Lipps P. Havery Moros , MD Age: 70 Referring MD:  Date of Birth: Jun 13, 1950 Gender: Male Account #: [id number] Procedure:                Colonoscopy Indications:              High risk colon cancer surveillance: Personal                            history of colonic polyps (05/2017 - 6 polyps                            removed - largest 80mm TVA) Medicines:                Monitored Anesthesia Care Procedure:                Pre-Anesthesia Assessment:                           - Prior to the procedure, a History and Physical                            was performed, and patient medications and                            allergies were reviewed. The patient's tolerance of                            previous anesthesia was also reviewed. The risks                            and benefits of the procedure and the sedation                            options and risks were discussed with the patient.                            All questions were answered, and informed consent                            was obtained. Prior Anticoagulants: The patient has                            taken no previous anticoagulant or antiplatelet                            agents. ASA Grade Assessment: II - A patient with                            mild systemic disease. After reviewing the risks                            and benefits, the patient was deemed in  satisfactory condition to undergo the procedure.                           After obtaining informed consent, the colonoscope                            was passed under direct vision. Throughout the                            procedure, the patient's blood pressure, pulse, and                            oxygen saturations were monitored continuously. The                            Olympus CF-HQ190L [id number])  Colonoscope was                            introduced through the anus and advanced to the the                            cecum, identified by appendiceal orifice and                            ileocecal valve. The colonoscopy was performed                            without difficulty. The patient tolerated the                            procedure well. The quality of the bowel                            preparation was good. The ileocecal valve,                            appendiceal orifice, and rectum were photographed. Scope In: 10:44:38 AM Scope Out: 11:02:42 AM Scope Withdrawal Time: 0 hours 16 minutes 0 seconds  Total Procedure Duration: 0 hours 18 minutes 4 seconds  Findings:                 The perianal and digital rectal examinations were                            normal.                           A 3 to 4 mm polyp was found in the transverse                            colon. The polyp was sessile. The polyp was removed                            with a cold snare. Resection and retrieval were  complete.                           Internal hemorrhoids were found during retroflexion.                           Anal papilla(e) were hypertrophied. Biopsies were                            taken with a cold forceps for histology to rule out                            AIN.                           The exam was otherwise without abnormality. Complications:            No immediate complications. Estimated blood loss:                            Minimal. Estimated Blood Loss:     Estimated blood loss was minimal. Impression:               - One 3 to 4 mm polyp in the transverse colon,                            removed with a cold snare. Resected and retrieved.                           - Internal hemorrhoids.                           - Anal papilla(e) were hypertrophied. Biopsied.                           - The examination was otherwise  normal. Recommendation:           - Patient has a contact number available for                            emergencies. The signs and symptoms of potential                            delayed complications were discussed with the                            patient. Return to normal activities tomorrow.                            Written discharge instructions were provided to the                            patient.                           - Resume previous diet.                           -  Continue present medications.                           - Await pathology results. Anticipate repeat                            colonoscopy in 5 years given multiple polyps                            removed on last exam including advanced TVA Lakendria Nicastro P. Alayzha An, MD 10/05/2020 11:06:13 AM This report has been signed electronically.

## 2020-10-07 ENCOUNTER — Telehealth: Payer: Self-pay | Admitting: *Deleted

## 2020-10-07 NOTE — Telephone Encounter (Signed)
  Follow up Call-  Call back number 10/05/2020  Post procedure Call Back phone  # [phone]  Permission to leave phone message Yes  Some recent data might be hidden     Patient questions:  Do you have a fever, pain , or abdominal swelling? No. Pain Score  0 *  Have you tolerated food without any problems? Yes.    Have you been able to return to your normal activities? Yes.    Do you have any questions about your discharge instructions: Diet   No. Medications  No. Follow up visit  No.  Do you have questions or concerns about your Care? Yes.    Patient having some spotting, not really a measurable quantity.  Advised patient if he sees the spotting increase to contact us, even if it's the weekend or night.  Actions: * If pain score is 4 or above: No action needed, pain <4.

## 2020-10-24 ENCOUNTER — Other Ambulatory Visit: Payer: Self-pay | Admitting: Family Medicine

## 2020-11-08 ENCOUNTER — Encounter: Payer: Self-pay | Admitting: Family Medicine

## 2020-11-08 ENCOUNTER — Other Ambulatory Visit: Payer: Self-pay

## 2020-11-08 ENCOUNTER — Ambulatory Visit (INDEPENDENT_AMBULATORY_CARE_PROVIDER_SITE_OTHER): Payer: Medicare Other | Admitting: Family Medicine

## 2020-11-08 VITALS — BP 138/62 | HR 90 | Temp 98.0°F | Resp 14 | Ht 68.0 in | Wt 225.0 lb

## 2020-11-08 DIAGNOSIS — M791 Myalgia, unspecified site: Secondary | ICD-10-CM

## 2020-11-08 MED ORDER — PREDNISONE 20 MG PO TABS: ORAL_TABLET | ORAL | 0 refills | Status: DC

## 2020-11-08 NOTE — Progress Notes (Signed)
Subjective:    Patient ID: Stephen Butler, male    DOB: 1950-10-08, 70 y.o.   MRN: [id number]  HPI  Patient is here today reporting diffuse muscle aches.  The pain is primarily in his hamstring and in his calf muscles bilaterally.  He also reports stiffness and pain in his neck and in his shoulders almost on a daily basis.  This has been a sudden change for him over the last month or so.  Over the last 2 weeks he is also developed burning and stinging pain in his Achilles tendon.  This is on his right ankle.  He has negative Thompson sign.  There is no tenderness to palpation around the calcaneus or the insertion on the calcaneus.  The pain radiates up the Achilles tendon to the base of the gastrocnemius. Past Medical History:  Diagnosis Date   Arthritis    Epistaxis    GERD (gastroesophageal reflux disease)    Hematochezia    Hypertension    Microscopic hematuria    Neuromuscular disorder (HCC)    neuropathy   Stenosis of right carotid artery greater than 50%    Past Surgical History:  Procedure Laterality Date   COLONOSCOPY     SHOULDER SURGERY     Current Outpatient Medications on File Prior to Visit  Medication Sig Dispense Refill   amLODipine (NORVASC) 10 MG tablet Take 1 tablet by mouth once daily 90 tablet 3   cloNIDine (CATAPRES) 0.1 MG tablet Take 1 tablet by mouth twice daily 60 tablet 0   hydrochlorothiazide (HYDRODIURIL) 25 MG tablet Take 1 tablet by mouth once daily 90 tablet 0   pantoprazole (PROTONIX) 40 MG tablet Take 1 tablet by mouth once daily 30 tablet 3   rosuvastatin (CRESTOR) 20 MG tablet Take 1 tablet by mouth daily 90 tablet 3   sildenafil (VIAGRA) 100 MG tablet TAKE 1/2 TO 1 (ONE-HALF TO ONE) TABLET BY MOUTH ONCE DAILY AS NEEDED FOR  ERECTILE  DYSFUNCTION. 11 tablet 0   gabapentin (NEURONTIN) 300 MG capsule Take 300 mg by mouth daily as needed. (Patient not taking: Reported on 11/08/2020)     losartan (COZAAR) 100 MG tablet Take 1 tablet (100 mg total) by  mouth daily. Stop LISINOPRIL (Patient not taking: Reported on 11/08/2020) 90 tablet 3   naproxen (NAPROSYN) 500 MG tablet Take 1 tablet (500 mg total) by mouth 2 (two) times daily with a meal. (Patient not taking: Reported on 11/08/2020) 30 tablet 1   valACYclovir (VALTREX) 1000 MG tablet Take 1 tablet (1,000 mg total) by mouth 2 (two) times daily. Take 1 tablet twice daily for 7-10 days within 1 day of symptom onset. (Patient not taking: Reported on 11/08/2020) 60 tablet 1   Current Facility-Administered Medications on File Prior to Visit  Medication Dose Route Frequency Provider Last Rate Last Admin   0.9 %  sodium chloride infusion  500 mL Intravenous Continuous Armbruster, Carlota Raspberry, MD       0.9 %  sodium chloride infusion  500 mL Intravenous Once Armbruster, Carlota Raspberry, MD        No Known Allergies Social History   Socioeconomic History   Marital status: Legally Separated    Spouse name: Not on file   Number of children: Not on file   Years of education: Not on file   Highest education level: Not on file  Occupational History   Not on file  Tobacco Use   Smoking status: Never   Smokeless tobacco: Never  Substance and Sexual Activity   Alcohol use: No   Drug use: No   Sexual activity: Not Currently  Other Topics Concern   Not on file  Social History Narrative   Not on file   Social Determinants of Health   Financial Resource Strain: Not on file  Food Insecurity: Not on file  Transportation Needs: Not on file  Physical Activity: Not on file  Stress: Not on file  Social Connections: Not on file  Intimate Partner Violence: Not on file   Family History  Problem Relation Age of Onset   Hypertension Mother    Heart attack Mother    Stroke Father    Colon polyps Neg Hx    Colon cancer Neg Hx    Esophageal cancer Neg Hx    Rectal cancer Neg Hx    Stomach cancer Neg Hx      Review of Systems     Objective:   Physical Exam Vitals reviewed.  Constitutional:       General: He is not in acute distress.    Appearance: Normal appearance. He is normal weight. He is not ill-appearing, toxic-appearing or diaphoretic.  HENT:     Head: Normocephalic and atraumatic.     Right Ear: Tympanic membrane and ear canal normal.     Left Ear: Tympanic membrane and ear canal normal.     Nose: Nose normal. No congestion or rhinorrhea.     Mouth/Throat:     Mouth: Mucous membranes are moist.     Pharynx: Oropharynx is clear. No oropharyngeal exudate or posterior oropharyngeal erythema.  Eyes:     Extraocular Movements: Extraocular movements intact.     Conjunctiva/sclera: Conjunctivae normal.     Pupils: Pupils are equal, round, and reactive to light.  Neck:     Vascular: No carotid bruit.  Cardiovascular:     Rate and Rhythm: Normal rate and regular rhythm.     Heart sounds: Normal heart sounds. No murmur heard.   No friction rub. No gallop.  Pulmonary:     Effort: Pulmonary effort is normal. No respiratory distress.     Breath sounds: Normal breath sounds. No stridor. No wheezing, rhonchi or rales.  Chest:     Chest wall: No tenderness.  Abdominal:     General: Bowel sounds are normal. There is no distension.     Palpations: Abdomen is soft.     Tenderness: There is no abdominal tenderness. There is no guarding or rebound.  Genitourinary:    Penis: Normal.      Testes: Normal.  Musculoskeletal:        General: No swelling, tenderness, deformity or signs of injury.     Cervical back: Neck supple.     Right lower leg: No edema.     Left lower leg: No edema.  Lymphadenopathy:     Cervical: No cervical adenopathy.  Skin:    Coloration: Skin is not jaundiced.     Findings: No bruising, erythema, lesion or rash.  Neurological:     General: No focal deficit present.     Mental Status: He is alert and oriented to person, place, and time. Mental status is at baseline.     Cranial Nerves: No cranial nerve deficit.     Sensory: No sensory deficit.     Motor:  No weakness.     Coordination: Coordination normal.     Gait: Gait normal.  Psychiatric:        Mood and Affect: Mood  normal.        Behavior: Behavior normal.        Thought Content: Thought content normal.        Judgment: Judgment normal.          Assessment & Plan:  Myalgia - Plan: CK, CBC with Differential/Platelet, COMPLETE METABOLIC PANEL WITH GFR, Sedimentation rate I am concerned that this could be a side effect of the rosuvastatin.  Temporarily discontinue rosuvastatin and check a CK level sed rate a CBC and a CMP.  I will treat the patient for Achilles tendinitis with prednisone taper pack for 6 days.  Reassess in 3 weeks to see if the muscle aches have improved

## 2020-11-09 LAB — CBC WITH DIFFERENTIAL/PLATELET
Absolute Monocytes: 678 cells/uL (ref 200–950)
Basophils Absolute: 38 cells/uL (ref 0–200)
Basophils Relative: 0.6 %
Eosinophils Absolute: 90 cells/uL (ref 15–500)
Eosinophils Relative: 1.4 %
HCT: 41.1 % (ref 38.5–50.0)
Hemoglobin: 13.4 g/dL (ref 13.2–17.1)
Lymphs Abs: 3168 cells/uL (ref 850–3900)
MCH: 26.3 pg — ABNORMAL LOW (ref 27.0–33.0)
MCHC: 32.6 g/dL (ref 32.0–36.0)
MCV: 80.6 fL (ref 80.0–100.0)
MPV: 11.6 fL (ref 7.5–12.5)
Monocytes Relative: 10.6 %
Neutro Abs: 2426 cells/uL (ref 1500–7800)
Neutrophils Relative %: 37.9 %
Platelets: 190 10*3/uL (ref 140–400)
RBC: 5.1 10*6/uL (ref 4.20–5.80)
RDW: 14.5 % (ref 11.0–15.0)
Total Lymphocyte: 49.5 %
WBC: 6.4 10*3/uL (ref 3.8–10.8)

## 2020-11-09 LAB — COMPLETE METABOLIC PANEL WITH GFR
AG Ratio: 1.4 (calc) (ref 1.0–2.5)
ALT: 30 U/L (ref 9–46)
AST: 27 U/L (ref 10–35)
Albumin: 4.4 g/dL (ref 3.6–5.1)
Alkaline phosphatase (APISO): 119 U/L (ref 35–144)
BUN: 10 mg/dL (ref 7–25)
CO2: 30 mmol/L (ref 20–32)
Calcium: 9.8 mg/dL (ref 8.6–10.3)
Chloride: 102 mmol/L (ref 98–110)
Creat: 1.1 mg/dL (ref 0.70–1.35)
Globulin: 3.1 g/dL (calc) (ref 1.9–3.7)
Glucose, Bld: 88 mg/dL (ref 65–99)
Potassium: 3.5 mmol/L (ref 3.5–5.3)
Sodium: 141 mmol/L (ref 135–146)
Total Bilirubin: 0.6 mg/dL (ref 0.2–1.2)
Total Protein: 7.5 g/dL (ref 6.1–8.1)
eGFR: 73 mL/min/{1.73_m2} (ref >=60)

## 2020-11-09 LAB — SEDIMENTATION RATE: Sed Rate: 2 mm/h (ref 0–20)

## 2020-11-09 LAB — CK: Total CK: 342 U/L — ABNORMAL HIGH (ref 44–196)

## 2020-11-11 ENCOUNTER — Other Ambulatory Visit: Payer: Self-pay | Admitting: *Deleted

## 2020-11-11 ENCOUNTER — Telehealth: Payer: Self-pay | Admitting: *Deleted

## 2020-11-11 NOTE — Telephone Encounter (Signed)
Received call from patient.   Reports that he has been taking prednisone taper as prescribed for achilles tendonitis. States that pain has not improved much at this time. States that he has x2 days of taper left.   Please advise.

## 2020-11-11 NOTE — Telephone Encounter (Signed)
Call placed to patient and patient made aware.  

## 2020-12-19 ENCOUNTER — Other Ambulatory Visit: Payer: Self-pay | Admitting: Family Medicine

## 2021-01-02 ENCOUNTER — Other Ambulatory Visit: Payer: Self-pay | Admitting: Family Medicine

## 2021-01-03 NOTE — Telephone Encounter (Signed)
Ok to refill Sildenafil?

## 2021-01-06 ENCOUNTER — Ambulatory Visit (INDEPENDENT_AMBULATORY_CARE_PROVIDER_SITE_OTHER): Payer: Medicare Other | Admitting: Family Medicine

## 2021-01-06 ENCOUNTER — Other Ambulatory Visit: Payer: Self-pay

## 2021-01-06 ENCOUNTER — Telehealth: Payer: Self-pay | Admitting: *Deleted

## 2021-01-06 VITALS — BP 142/88 | HR 72 | Temp 98.0°F | Resp 16 | Ht 68.0 in | Wt 224.0 lb

## 2021-01-06 DIAGNOSIS — I10 Essential (primary) hypertension: Secondary | ICD-10-CM

## 2021-01-06 DIAGNOSIS — I6521 Occlusion and stenosis of right carotid artery: Secondary | ICD-10-CM

## 2021-01-06 DIAGNOSIS — M791 Myalgia, unspecified site: Secondary | ICD-10-CM | POA: Diagnosis not present

## 2021-01-06 DIAGNOSIS — Z23 Encounter for immunization: Secondary | ICD-10-CM | POA: Diagnosis not present

## 2021-01-06 MED ORDER — ATORVASTATIN CALCIUM 10 MG PO TABS: 10.0000 mg | ORAL_TABLET | Freq: Every day | ORAL | 3 refills | Status: DC

## 2021-01-06 MED ORDER — CLONIDINE HCL 0.1 MG PO TABS: 0.1000 mg | ORAL_TABLET | Freq: Two times a day (BID) | ORAL | 3 refills | Status: DC

## 2021-01-06 MED ORDER — NAPROXEN 500 MG PO TABS: 500.0000 mg | ORAL_TABLET | Freq: Two times a day (BID) | ORAL | 1 refills | Status: DC

## 2021-01-06 NOTE — Progress Notes (Signed)
Subjective:    Patient ID: Stephen Butler, male    DOB: 07-20-1950, 70 y.o.   MRN: [id number]  HPI  Please see last visit.  Patient had severe myalgias all over his body.  He was found to have an elevated CK level greater than 300.  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  We held his Crestor.  The majority of his myalgias have improved although he continues to complain of aching and throbbing pain in both shoulders.  He definitely states that he is doing better than previously when he was taking the Crestor.  Unfortunately he has asymptomatic right carotid artery stenosis with a 40 to 69% blockage in the right internal carotid artery seen on his most recent carotid Doppler.  He would benefit from taking a statin.  He is also not taking an aspirin Past Medical History:  Diagnosis Date   Arthritis    Epistaxis    GERD (gastroesophageal reflux disease)    Hematochezia    Hypertension    Microscopic hematuria    Neuromuscular disorder (HCC)    neuropathy   Stenosis of right carotid artery greater than 50%    Past Surgical History:  Procedure Laterality Date   COLONOSCOPY     SHOULDER SURGERY     Current Outpatient Medications on File Prior to Visit  Medication Sig Dispense Refill   amLODipine (NORVASC) 10 MG tablet Take 1 tablet by mouth once daily 90 tablet 3   gabapentin (NEURONTIN) 300 MG capsule Take 300 mg by mouth daily as needed.     hydrochlorothiazide (HYDRODIURIL) 25 MG tablet Take 1 tablet by mouth once daily 90 tablet 0   losartan (COZAAR) 100 MG tablet Take 1 tablet (100 mg total) by mouth daily. Stop LISINOPRIL 90 tablet 3   pantoprazole (PROTONIX) 40 MG tablet Take 1 tablet by mouth once daily 30 tablet 0   sildenafil (VIAGRA) 100 MG tablet TAKE 1/2 TO 1 (ONE-HALF TO ONE) TABLET BY MOUTH ONCE DAILY AS NEEDED FOR ERECTILE DYSFUNCTION 11 tablet 0   valACYclovir (VALTREX) 1000 MG tablet Take 1 tablet (1,000 mg total) by mouth 2 (two) times daily. Take 1 tablet twice daily for  7-10 days within 1 day of symptom onset. 60 tablet 1   No current facility-administered medications on file prior to visit.    No Known Allergies Social History   Socioeconomic History   Marital status: Legally Separated    Spouse name: Not on file   Number of children: Not on file   Years of education: Not on file   Highest education level: Not on file  Occupational History   Not on file  Tobacco Use   Smoking status: Never   Smokeless tobacco: Never  Substance and Sexual Activity   Alcohol use: No   Drug use: No   Sexual activity: Not Currently  Other Topics Concern   Not on file  Social History Narrative   Not on file   Social Determinants of Health   Financial Resource Strain: Not on file  Food Insecurity: Not on file  Transportation Needs: Not on file  Physical Activity: Not on file  Stress: Not on file  Social Connections: Not on file  Intimate Partner Violence: Not on file   Family History  Problem Relation Age of Onset   Hypertension Mother    Heart attack Mother    Stroke Father    Colon polyps Neg Hx    Colon cancer Neg Hx    Esophageal cancer  Neg Hx    Rectal cancer Neg Hx    Stomach cancer Neg Hx      Review of Systems  All other systems reviewed and are negative.     Objective:   Physical Exam Vitals reviewed.  Constitutional:      General: He is not in acute distress.    Appearance: Normal appearance. He is well-developed and normal weight. He is not ill-appearing, toxic-appearing or diaphoretic.  HENT:     Head: Normocephalic and atraumatic.     Right Ear: Tympanic membrane and ear canal normal.     Left Ear: Tympanic membrane and ear canal normal.     Nose: Nose normal. No congestion or rhinorrhea.     Mouth/Throat:     Mouth: Mucous membranes are moist.     Pharynx: Oropharynx is clear. No oropharyngeal exudate or posterior oropharyngeal erythema.  Eyes:     Extraocular Movements: Extraocular movements intact.      Conjunctiva/sclera: Conjunctivae normal.     Pupils: Pupils are equal, round, and reactive to light.  Neck:     Vascular: No carotid bruit.  Cardiovascular:     Rate and Rhythm: Normal rate and regular rhythm.     Heart sounds: Normal heart sounds. No murmur heard.   No friction rub. No gallop.  Pulmonary:     Effort: Pulmonary effort is normal. No respiratory distress.     Breath sounds: Normal breath sounds. No stridor. No wheezing, rhonchi or rales.  Chest:     Chest wall: No tenderness.  Abdominal:     General: Bowel sounds are normal. There is no distension.     Palpations: Abdomen is soft.     Tenderness: There is no abdominal tenderness. There is no guarding or rebound.  Musculoskeletal:        General: No swelling, tenderness, deformity or signs of injury.     Cervical back: Neck supple.     Right lower leg: No edema.     Left lower leg: No edema.  Lymphadenopathy:     Cervical: No cervical adenopathy.  Skin:    Coloration: Skin is not jaundiced.     Findings: No bruising, erythema, lesion or rash.  Neurological:     General: No focal deficit present.     Mental Status: He is alert and oriented to person, place, and time. Mental status is at baseline.     Cranial Nerves: No cranial nerve deficit.     Sensory: No sensory deficit.     Motor: No weakness or abnormal muscle tone.     Coordination: Coordination normal.     Gait: Gait normal.     Deep Tendon Reflexes: Reflexes are normal and symmetric.  Psychiatric:        Mood and Affect: Mood normal.        Behavior: Behavior normal.        Thought Content: Thought content normal.        Judgment: Judgment normal.          Assessment & Plan:  Myalgia - Plan: atorvastatin (LIPITOR) 10 MG tablet, Sedimentation rate, COMPLETE METABOLIC PANEL WITH GFR, CK  Need for immunization against influenza - Plan: Flu Vaccine QUAD High Dose(Fluad)  Benign essential HTN  Stenosis of right carotid artery Given the muscle  aches in his shoulder, I suspect arthritis.  However I would like to obtain a CK level to ensure resolution of the statin induced myopathy.  I would also check a sed  rate to rule out polymyalgia rheumatica.  If lab work is normal, I would recommend the patient start Lipitor 10 mg a day in an effort to get him on a statin due to his carotid artery stenosis and treat the shoulder pain is osteoarthritis.  If his CK level is still elevated or his sed rate is extremely high, I would recommend rheumatology consultation for possible myositis versus polymyalgia rheumatica.  Patient needs to be on a statin if tolerated.  If not I would use Repatha.  Strongly encourage the patient to take an aspirin 81 mg daily

## 2021-01-06 NOTE — Telephone Encounter (Signed)
Received call from patient.   Reports that he forgot to mention that he is having increased restlessness and pain in lower legs at night. Inquired as to what he can do for Sx.   Advised to use OTC pain reliever or muscle rubs.   Please advise.

## 2021-01-07 LAB — COMPLETE METABOLIC PANEL WITH GFR
AG Ratio: 1.4 (calc) (ref 1.0–2.5)
ALT: 29 U/L (ref 9–46)
AST: 26 U/L (ref 10–35)
Albumin: 4.3 g/dL (ref 3.6–5.1)
Alkaline phosphatase (APISO): 128 U/L (ref 35–144)
BUN: 12 mg/dL (ref 7–25)
CO2: 30 mmol/L (ref 20–32)
Calcium: 9.8 mg/dL (ref 8.6–10.3)
Chloride: 102 mmol/L (ref 98–110)
Creat: 1.16 mg/dL (ref 0.70–1.35)
Globulin: 3 g/dL (calc) (ref 1.9–3.7)
Glucose, Bld: 117 mg/dL — ABNORMAL HIGH (ref 65–99)
Potassium: 3.5 mmol/L (ref 3.5–5.3)
Sodium: 140 mmol/L (ref 135–146)
Total Bilirubin: 0.5 mg/dL (ref 0.2–1.2)
Total Protein: 7.3 g/dL (ref 6.1–8.1)
eGFR: 69 mL/min/{1.73_m2} (ref >=60)

## 2021-01-07 LAB — SEDIMENTATION RATE: Sed Rate: 2 mm/h (ref 0–20)

## 2021-01-07 LAB — CK: Total CK: 422 U/L — ABNORMAL HIGH (ref 44–196)

## 2021-01-08 ENCOUNTER — Ambulatory Visit (INDEPENDENT_AMBULATORY_CARE_PROVIDER_SITE_OTHER): Payer: Medicare Other | Admitting: Family Medicine

## 2021-01-08 DIAGNOSIS — Z0000 Encounter for general adult medical examination without abnormal findings: Secondary | ICD-10-CM | POA: Diagnosis not present

## 2021-01-08 NOTE — Progress Notes (Signed)
Subjective:   Stephen Butler is a 70 y.o. male who presents for Medicare Annual/Subsequent preventive examination.  I connected with  Renato Shin on 01/08/21 by an audio only telemedicine application and verified that I am speaking with the correct person using two identifiers.   I discussed the limitations, risks, security and privacy concerns of performing an evaluation and management service by telephone and the availability of in person appointments. I also discussed with the patient that there may be a patient responsible charge related to this service. The patient expressed understanding and verbally consented to this telephonic visit.  Location of Patient: Home Location of Provider: Office  List any persons and their role that are participating in the visit with the patient.  Anders Simmonds, CMA  Review of Systems    Refer to PCP       Objective:    There were no vitals filed for this visit. There is no height or weight on file to calculate BMI.  Advanced Directives 07/16/2016  Does Patient Have a Medical Advance Directive? No  Would patient like information on creating a medical advance directive? No - Patient declined    Current Medications (verified) Outpatient Encounter Medications as of 01/08/2021  Medication Sig   amLODipine (NORVASC) 10 MG tablet Take 1 tablet by mouth once daily   atorvastatin (LIPITOR) 10 MG tablet Take 1 tablet (10 mg total) by mouth daily.   cloNIDine (CATAPRES) 0.1 MG tablet Take 1 tablet (0.1 mg total) by mouth 2 (two) times daily.   gabapentin (NEURONTIN) 300 MG capsule Take 300 mg by mouth daily as needed.   hydrochlorothiazide (HYDRODIURIL) 25 MG tablet Take 1 tablet by mouth once daily   losartan (COZAAR) 100 MG tablet Take 1 tablet (100 mg total) by mouth daily. Stop LISINOPRIL   naproxen (NAPROSYN) 500 MG tablet Take 1 tablet (500 mg total) by mouth 2 (two) times daily with a meal.   pantoprazole (PROTONIX) 40 MG tablet Take 1  tablet by mouth once daily   sildenafil (VIAGRA) 100 MG tablet TAKE 1/2 TO 1 (ONE-HALF TO ONE) TABLET BY MOUTH ONCE DAILY AS NEEDED FOR ERECTILE DYSFUNCTION   valACYclovir (VALTREX) 1000 MG tablet Take 1 tablet (1,000 mg total) by mouth 2 (two) times daily. Take 1 tablet twice daily for 7-10 days within 1 day of symptom onset.   No facility-administered encounter medications on file as of 01/08/2021.    Allergies (verified) Patient has no known allergies.   History: Past Medical History:  Diagnosis Date   Arthritis    Epistaxis    GERD (gastroesophageal reflux disease)    Hematochezia    Hypertension    Microscopic hematuria    Neuromuscular disorder (HCC)    neuropathy   Stenosis of right carotid artery greater than 50%    Past Surgical History:  Procedure Laterality Date   COLONOSCOPY     SHOULDER SURGERY     Family History  Problem Relation Age of Onset   Hypertension Mother    Heart attack Mother    Stroke Father    Colon polyps Neg Hx    Colon cancer Neg Hx    Esophageal cancer Neg Hx    Rectal cancer Neg Hx    Stomach cancer Neg Hx    Social History   Socioeconomic History   Marital status: Married    Spouse name: Not on file   Number of children: 0   Years of education: Not on file   Highest  education level: Not on file  Occupational History   Occupation: retired  Tobacco Use   Smoking status: Never   Smokeless tobacco: Never  Vaping Use   Vaping Use: Never used  Substance and Sexual Activity   Alcohol use: No   Drug use: No   Sexual activity: Yes    Partners: Female  Other Topics Concern   Not on file  Social History Narrative   Not on file   Social Determinants of Health   Financial Resource Strain: Low Risk    Difficulty of Paying Living Expenses: Not hard at all  Food Insecurity: No Food Insecurity   Worried About Charity fundraiser in the Last Year: Never true   [place] in the Last Year: Never true  Transportation Needs: No  Transportation Needs   Lack of Transportation (Medical): No   Lack of Transportation (Non-Medical): No  Physical Activity: Sufficiently Active   Days of Exercise per Week: 5 days   Minutes of Exercise per Session: 60 min  Stress: Stress Concern Present   Feeling of Stress : To some extent  Social Connections: Socially Integrated   Frequency of Communication with Friends and Family: Three times a week   Frequency of Social Gatherings with Friends and Family: Once a week   Attends Religious Services: More than 4 times per year   Active Member of [employer] or Organizations: Yes   Attends Music therapist: More than 4 times per year   Marital Status: Married    Tobacco Counseling Counseling given: Not Answered   Clinical Intake:                 Diabetic?NO         Activities of Daily Living In your present state of health, do you have any difficulty performing the following activities: 01/08/2021  Hearing? N  Vision? N  Difficulty concentrating or making decisions? N  Walking or climbing stairs? N  Dressing or bathing? N  Doing errands, shopping? N  Some recent data might be hidden    Patient Care Team: Susy Frizzle, MD as PCP - General (Family Medicine)  Indicate any recent Medical Services you may have received from other than [facility] providers in the past year (date may be approximate).     Assessment:   This is a routine wellness examination for Stephen Butler.  Hearing/Vision screen No results found.  Dietary issues and exercise activities discussed:     Goals Addressed   None   Depression Screen PHQ 2/9 Scores 01/08/2021 01/08/2021 07/31/2018 07/02/2017  PHQ - 2 Score 0 0 0 0    Fall Risk Fall Risk  01/08/2021 06/13/2020 07/31/2018 07/02/2017  Falls in the past year? 0 0 0 No  Number falls in past yr: 0 0 - -  Injury with Fall? 0 0 - -  Risk for fall due to : No Fall Risks No Fall Risks - -  Follow up Follow up appointment Falls evaluation  completed Falls evaluation completed -    FALL RISK PREVENTION PERTAINING TO THE HOME:  Any stairs in or around the home? Yes  If so, are there any without handrails? Yes  Home free of loose throw rugs in walkways, pet beds, electrical cords, etc? Yes  Adequate lighting in your home to reduce risk of falls? Yes   ASSISTIVE DEVICES UTILIZED TO PREVENT FALLS:  Life alert? No  Use of a cane, walker or w/c? No  Grab bars in  the bathroom? No  Shower chair or bench in shower? No  Elevated toilet seat or a handicapped toilet? No   TIMED UP AND GO:  Was the test performed? No .  Length of time to ambulate 10 feet:  sec.   Telehealth  Cognitive Function:     6CIT Screen 01/08/2021  What Year? 0 points  What month? 0 points  What time? 0 points  Count back from 20 2 points  Months in reverse 2 points  Repeat phrase 6 points  Total Score 10    Immunizations Immunization History  Administered Date(s) Administered   Fluad Quad(high Dose 65+) 01/06/2021   PFIZER(Purple Top)SARS-COV-2 Vaccination 06/25/2019, 07/20/2019   Pneumococcal Conjugate-13 06/13/2020   Pneumococcal Polysaccharide-23 07/02/2017    TDAP status: Due, Education has been provided regarding the importance of this vaccine. Advised may receive this vaccine at local pharmacy or Health Dept. Aware to provide a copy of the vaccination record if obtained from local pharmacy or Health Dept. Verbalized acceptance and understanding.  Flu Vaccine status: Up to date  Pneumococcal vaccine status: Up to date  Covid-19 vaccine status: Completed vaccines  Qualifies for Shingles Vaccine? Yes   Zostavax completed No   Shingrix Completed?: No.    Education has been provided regarding the importance of this vaccine. Patient has been advised to call insurance company to determine out of pocket expense if they have not yet received this vaccine. Advised may also receive vaccine at local pharmacy or Health Dept. Verbalized  acceptance and understanding.  Screening Tests Health Maintenance  Topic Date Due   TETANUS/TDAP  Never done   Zoster Vaccines- Shingrix (1 of 2) Never done   COVID-19 Vaccine (3 - Booster for Pfizer series) 12/20/2019   COLONOSCOPY (Pts 45-36yrs Insurance coverage will need to be confirmed)  10/05/2025   INFLUENZA VACCINE  Completed   Hepatitis C Screening  Completed   HPV VACCINES  Aged Out    Health Maintenance  Health Maintenance Due  Topic Date Due   TETANUS/TDAP  Never done   Zoster Vaccines- Shingrix (1 of 2) Never done   COVID-19 Vaccine (3 - Booster for Pfizer series) 12/20/2019    Colorectal cancer screening: Type of screening: Colonoscopy. Completed 09/2020. Repeat every 5 years  Lung Cancer Screening: (Low Dose CT Chest recommended if Age 56-80 years, 30 pack-year currently smoking OR have quit w/in 15years.) does not qualify.   Lung Cancer Screening Referral:   Additional Screening:  Hepatitis C Screening: does qualify; Plans to discuss with PCP  Vision Screening: Recommended annual ophthalmology exams for early detection of glaucoma and other disorders of the eye. Is the patient up to date with their annual eye exam?  Yes  Who is the provider or what is the name of the office in which the patient attends annual eye exams? Doesn't remember name If pt is not established with a provider, would they like to be referred to a provider to establish care? No .   Dental Screening: Recommended annual dental exams for proper oral hygiene  Community Resource Referral / Chronic Care Management: CRR required this visit?  No   CCM required this visit?  No      Plan:     I have personally reviewed and noted the following in the patient's chart:   Medical and social history Use of alcohol, tobacco or illicit drugs  Current medications and supplements including opioid prescriptions. Patient is not currently taking opioid prescriptions. Functional ability and  status  Nutritional status Physical activity Advanced directives List of other physicians Hospitalizations, surgeries, and ER visits in previous 12 months Vitals Screenings to include cognitive, depression, and falls Referrals and appointments  In addition, I have reviewed and discussed with patient certain preventive protocols, quality metrics, and best practice recommendations. A written personalized care plan for preventive services as well as general preventive health recommendations were provided to patient.     [place], [place]   01/08/2021   Nurse Notes: Non-face to face; 30 Mins

## 2021-01-09 ENCOUNTER — Other Ambulatory Visit: Payer: Self-pay | Admitting: Family Medicine

## 2021-01-09 ENCOUNTER — Other Ambulatory Visit: Payer: Self-pay | Admitting: *Deleted

## 2021-01-09 DIAGNOSIS — M7918 Myalgia, other site: Secondary | ICD-10-CM

## 2021-01-09 DIAGNOSIS — M791 Myalgia, unspecified site: Secondary | ICD-10-CM

## 2021-01-09 NOTE — Telephone Encounter (Signed)
Call placed to patient and patient made aware.   Agreeable to plan.  

## 2021-02-02 ENCOUNTER — Other Ambulatory Visit: Payer: Self-pay | Admitting: Family Medicine

## 2021-02-07 ENCOUNTER — Other Ambulatory Visit: Payer: Self-pay

## 2021-02-07 ENCOUNTER — Encounter (HOSPITAL_COMMUNITY): Payer: Self-pay | Admitting: Emergency Medicine

## 2021-02-07 ENCOUNTER — Emergency Department (HOSPITAL_COMMUNITY): Payer: Medicare Other

## 2021-02-07 ENCOUNTER — Emergency Department (HOSPITAL_COMMUNITY)
Admission: EM | Admit: 2021-02-07 | Discharge: 2021-02-07 | Disposition: A | Discharge status: Home / Self Care | Payer: Medicare Other | Attending: Emergency Medicine | Admitting: Emergency Medicine

## 2021-02-07 DIAGNOSIS — I10 Essential (primary) hypertension: Secondary | ICD-10-CM | POA: Diagnosis not present

## 2021-02-07 DIAGNOSIS — R202 Paresthesia of skin: Secondary | ICD-10-CM | POA: Diagnosis not present

## 2021-02-07 DIAGNOSIS — R42 Dizziness and giddiness: Secondary | ICD-10-CM | POA: Insufficient documentation

## 2021-02-07 DIAGNOSIS — Z79899 Other long term (current) drug therapy: Secondary | ICD-10-CM | POA: Insufficient documentation

## 2021-02-07 DIAGNOSIS — R110 Nausea: Secondary | ICD-10-CM | POA: Diagnosis not present

## 2021-02-07 LAB — CBC WITH DIFFERENTIAL/PLATELET
Abs Immature Granulocytes: 0.01 10*3/uL (ref 0.00–0.07)
Basophils Absolute: 0 10*3/uL (ref 0.0–0.1)
Basophils Relative: 1 %
Eosinophils Absolute: 0.1 10*3/uL (ref 0.0–0.5)
Eosinophils Relative: 1 %
HCT: 40.7 % (ref 39.0–52.0)
Hemoglobin: 13.3 g/dL (ref 13.0–17.0)
Immature Granulocytes: 0 %
Lymphocytes Relative: 38 %
Lymphs Abs: 2.4 10*3/uL (ref 0.7–4.0)
MCH: 27 pg (ref 26.0–34.0)
MCHC: 32.7 g/dL (ref 30.0–36.0)
MCV: 82.6 fL (ref 80.0–100.0)
Monocytes Absolute: 0.6 10*3/uL (ref 0.1–1.0)
Monocytes Relative: 10 %
Neutro Abs: 3.2 10*3/uL (ref 1.7–7.7)
Neutrophils Relative %: 50 %
Platelets: 200 10*3/uL (ref 150–400)
RBC: 4.93 MIL/uL (ref 4.22–5.81)
RDW: 15.3 % (ref 11.5–15.5)
WBC: 6.4 10*3/uL (ref 4.0–10.5)
nRBC: 0 % (ref 0.0–0.2)

## 2021-02-07 LAB — BASIC METABOLIC PANEL
Anion gap: 5 (ref 5–15)
BUN: 13 mg/dL (ref 8–23)
CO2: 28 mmol/L (ref 22–32)
Calcium: 9.2 mg/dL (ref 8.9–10.3)
Chloride: 107 mmol/L (ref 98–111)
Creatinine, Ser: 0.96 mg/dL (ref 0.61–1.24)
GFR, Estimated: >60 mL/min (ref >60)
Glucose, Bld: 105 mg/dL — ABNORMAL HIGH (ref 70–99)
Potassium: 3.7 mmol/L (ref 3.5–5.1)
Sodium: 140 mmol/L (ref 135–145)

## 2021-02-07 MED ORDER — MECLIZINE HCL 25 MG PO TABS: 25.0000 mg | ORAL_TABLET | Freq: Three times a day (TID) | ORAL | 0 refills | Status: DC | PRN

## 2021-02-07 MED ORDER — SODIUM CHLORIDE 0.9 % IV BOLUS
1000.0000 mL | Freq: Once | INTRAVENOUS | Status: AC
  Administered 2021-02-07: 1000 mL via INTRAVENOUS

## 2021-02-07 MED ORDER — MECLIZINE HCL 12.5 MG PO TABS
25.0000 mg | ORAL_TABLET | Freq: Once | ORAL | Status: AC
  Administered 2021-02-07: 25 mg via ORAL
  Filled 2021-02-07: qty 2

## 2021-02-07 MED ORDER — DIAZEPAM 5 MG/ML IJ SOLN
5.0000 mg | Freq: Once | INTRAMUSCULAR | Status: AC
  Administered 2021-02-07: 5 mg via INTRAVENOUS
  Filled 2021-02-07: qty 2

## 2021-02-07 NOTE — ED Notes (Signed)
Pt not able to turn head to left while in bed.  C/o dizziness.

## 2021-02-07 NOTE — ED Provider Notes (Signed)
[facility] EMERGENCY DEPARTMENT Provider Note   CSN: [id number] Arrival date & time: 02/07/21  7564     History Chief Complaint  Patient presents with   Dizziness    Stephen Butler is a 70 y.o. male.  Patient is a 70 year old male with past medical history of hypertension, neuropathy, GERD, nosebleed, hyperlipidemia.  Patient presenting today with complaints of dizziness.  This woke him from sleep this evening.  He describes a spinning sensation that occurs when he attempts to turn on his left side.  He feels nauseated but has not vomited.  Symptoms are improved with remaining still.  The history is provided by the patient.  Dizziness Quality:  Room spinning Severity:  Moderate Onset quality:  Sudden Duration:  2 hours Timing:  Constant Progression:  Worsening Chronicity:  New Relieved by:  Nothing Worsened by:  Sitting upright, turning head and movement     Past Medical History:  Diagnosis Date   Arthritis    Epistaxis    GERD (gastroesophageal reflux disease)    Hematochezia    Hypertension    Microscopic hematuria    Neuromuscular disorder (HCC)    neuropathy   Stenosis of right carotid artery greater than 50%     Patient Active Problem List   Diagnosis Date Noted   Carotid stenosis 08/12/2018    Past Surgical History:  Procedure Laterality Date   COLONOSCOPY     SHOULDER SURGERY         Family History  Problem Relation Age of Onset   Hypertension Mother    Heart attack Mother    Stroke Father    Colon polyps Neg Hx    Colon cancer Neg Hx    Esophageal cancer Neg Hx    Rectal cancer Neg Hx    Stomach cancer Neg Hx     Social History   Tobacco Use   Smoking status: Never   Smokeless tobacco: Never  Vaping Use   Vaping Use: Never used  Substance Use Topics   Alcohol use: No   Drug use: No    Home Medications Prior to Admission medications   Medication Sig Start Date End Date Taking? Authorizing Provider  amLODipine (NORVASC) 10  MG tablet Take 1 tablet by mouth once daily 07/18/20   Susy Frizzle, MD  atorvastatin (LIPITOR) 10 MG tablet Take 1 tablet (10 mg total) by mouth daily. 01/06/21   Susy Frizzle, MD  cloNIDine (CATAPRES) 0.1 MG tablet Take 1 tablet (0.1 mg total) by mouth 2 (two) times daily. 01/06/21   Susy Frizzle, MD  gabapentin (NEURONTIN) 300 MG capsule Take 300 mg by mouth daily as needed.    [provider]  hydrochlorothiazide (HYDRODIURIL) 25 MG tablet Take 1 tablet by mouth once daily 02/02/21   Susy Frizzle, MD  losartan (COZAAR) 100 MG tablet Take 1 tablet (100 mg total) by mouth daily. Stop LISINOPRIL 10/02/19   Susy Frizzle, MD  naproxen (NAPROSYN) 500 MG tablet Take 1 tablet (500 mg total) by mouth 2 (two) times daily with a meal. 01/06/21   Susy Frizzle, MD  pantoprazole (PROTONIX) 40 MG tablet Take 1 tablet by mouth once daily 12/22/20   Susy Frizzle, MD  sildenafil (VIAGRA) 100 MG tablet TAKE 1/2 TO 1 (ONE-HALF TO ONE) TABLET BY MOUTH ONCE DAILY AS NEEDED FOR ERECTILE DYSFUNCTION 01/03/21   Susy Frizzle, MD  valACYclovir (VALTREX) 1000 MG tablet Take 1 tablet (1,000 mg total) by mouth 2 (  two) times daily. Take 1 tablet twice daily for 7-10 days within 1 day of symptom onset. 06/15/20   Eulogio Bear, NP    Allergies    Patient has no known allergies.  Review of Systems   Review of Systems  Neurological:  Positive for dizziness.  All other systems reviewed and are negative.  Physical Exam Updated Vital Signs BP (!) 156/84   Pulse 73   Temp 98 F (36.7 C) (Oral)   Resp 17   Ht 5\' 8"  (1.727 m)   Wt 97.5 kg   SpO2 98%   BMI 32.69 kg/m   Physical Exam Vitals and nursing note reviewed.  Constitutional:      General: He is not in acute distress.    Appearance: He is well-developed. He is not diaphoretic.  HENT:     Head: Normocephalic and atraumatic.  Eyes:     Extraocular Movements: Extraocular movements intact.     Pupils: Pupils are  equal, round, and reactive to light.  Cardiovascular:     Rate and Rhythm: Normal rate and regular rhythm.     Heart sounds: No murmur heard.   No friction rub.  Pulmonary:     Effort: Pulmonary effort is normal. No respiratory distress.     Breath sounds: Normal breath sounds. No wheezing or rales.  Abdominal:     General: Bowel sounds are normal. There is no distension.     Palpations: Abdomen is soft.     Tenderness: There is no abdominal tenderness.  Musculoskeletal:        General: Normal range of motion.     Cervical back: Normal range of motion and neck supple.  Skin:    General: Skin is warm and dry.  Neurological:     General: No focal deficit present.     Mental Status: He is alert and oriented to person, place, and time.     Cranial Nerves: No cranial nerve deficit.     Motor: No weakness.     Coordination: Coordination normal.    ED Results / Procedures / Treatments   Labs (all labs ordered are listed, but only abnormal results are displayed) Labs Reviewed  BASIC METABOLIC PANEL  CBC WITH DIFFERENTIAL/PLATELET    EKG None  Radiology No results found.  Procedures Procedures   Medications Ordered in ED Medications  sodium chloride 0.9 % bolus 1,000 mL (has no administration in time range)  meclizine (ANTIVERT) tablet 25 mg (has no administration in time range)    ED Course  I have reviewed the triage vital signs and the nursing notes.  Pertinent labs & imaging results that were available during my care of the patient were reviewed by me and considered in my medical decision making (see chart for details).    MDM Rules/Calculators/A&P  Patient presents here with complaints of dizziness.  He describes a spinning sensation when he turns his head to the left and when he attempts to roll over onto his left side.  This started acutely in the night.  He has no other neurologic symptoms and is otherwise neurologically intact.  Laboratory studies and CT scan  are reassuring.  He has received IV fluids, meclizine, and Valium, but continues to feel somewhat dizzy.  Patient will be reassessed in the near future.  If symptoms improve, I feel as though discharge is appropriate.  If not, patient may require MRI to rule out posterior circulation stroke.    Care will be signed out to  oncoming provider at shift change.  Final Clinical Impression(s) / ED Diagnoses Final diagnoses:  None    Rx / DC Orders ED Discharge Orders     None        Veryl Speak, MD 02/07/21 [phone]

## 2021-02-07 NOTE — ED Notes (Signed)
Pt transported to MRI 

## 2021-02-07 NOTE — ED Triage Notes (Signed)
Pt states he was lying in bed and when he tried to roll over the room began spinning. The patient was hypertensive when his BP was taken at home.

## 2021-02-07 NOTE — ED Provider Notes (Signed)
Patient signed out to me by previous provider. Please refer to their note for full HPI.  Briefly this is a 70 year old male who presented with dizziness.  Thought to be peripheral vertigo, was treated aggressively, negative head CT.  We are pending reevaluation. Physical Exam  BP (!) 177/95 (BP Location: Left Arm)   Pulse 69   Temp 98.7 F (37.1 C) (Oral)   Resp 18   Ht 5\' 8"  (1.727 m)   Wt 97.5 kg   SpO2 100%   BMI 32.69 kg/m   Physical Exam Vitals and nursing note reviewed.  Constitutional:      Appearance: Normal appearance.  HENT:     Head: Normocephalic.     Mouth/Throat:     Mouth: Mucous membranes are moist.  Cardiovascular:     Rate and Rhythm: Normal rate.  Pulmonary:     Effort: Pulmonary effort is normal. No respiratory distress.  Abdominal:     Palpations: Abdomen is soft.     Tenderness: There is no abdominal tenderness.  Skin:    General: Skin is warm.  Neurological:     Mental Status: He is alert and oriented to person, place, and time. Mental status is at baseline.     Cranial Nerves: No cranial nerve deficit.     Sensory: No sensory deficit.     Motor: No weakness.     Coordination: Coordination abnormal.     Gait: Gait normal.  Psychiatric:        Mood and Affect: Mood normal.    ED Course/Procedures     Procedures  MDM   On reevaluation patient still feels very dizzy with position changes, unsteady on his feet.  I agree that this is most likely positional vertigo however MRI was pursued to rule out stroke.  This showed no acute abnormalities.  After an additional dose of meclizine patient was able to eat, drink and ambulate without difficulty although still slightly symptomatic.  Advised outpatient follow-up with ENT.  Patient at this time appears safe and stable for discharge and will be treated as an outpatient.  Discharge plan and strict return to ED precautions discussed, patient verbalizes understanding and agreement.       Lorelle Gibbs, DO 02/07/21 1504

## 2021-02-07 NOTE — ED Notes (Signed)
Pt reports being about to sit up and walk but not able to turn head to the left.

## 2021-02-07 NOTE — Discharge Instructions (Addendum)
You were seen in the emergency department for dizziness.  Your blood work, head CT and head MRI were normal.  We believe you are suffering from vertigo.  Begin taking meclizine as prescribed.  Rest.  Follow-up with primary doctor if symptoms are not improving in the next few days, and return to the ER symptoms significantly worsen or change. Establish care with ENT for more in depth evaluation and treatment.

## 2021-02-07 NOTE — ED Notes (Signed)
Pt returned from MRI °

## 2021-02-09 ENCOUNTER — Telehealth: Payer: Self-pay | Admitting: *Deleted

## 2021-02-09 NOTE — Telephone Encounter (Signed)
Received request from pharmacy for PA on Clonidine 0.1mg  ER  PA submitted.   Dx: I10 HTN  Your information has been sent to [facility].

## 2021-02-10 ENCOUNTER — Other Ambulatory Visit: Payer: Self-pay | Admitting: *Deleted

## 2021-02-10 MED ORDER — CLONIDINE HCL 0.1 MG PO TABS: 0.1000 mg | ORAL_TABLET | Freq: Two times a day (BID) | ORAL | 3 refills | Status: DC

## 2021-02-10 MED ORDER — PANTOPRAZOLE SODIUM 40 MG PO TBEC: 40.0000 mg | DELAYED_RELEASE_TABLET | Freq: Every day | ORAL | 3 refills | Status: DC

## 2021-02-10 NOTE — Addendum Note (Signed)
Addended by: Sheral Flow on: 02/10/2021 01:05 PM   Modules accepted: Orders

## 2021-02-10 NOTE — Telephone Encounter (Signed)
Received PA determination.   PA denied.   CLONIDINE TAB 0.1MG  ER is not FDA approved for your medical condition(s): Hypertension. These condition(s) are not supported by one of the accepted references. Therefore your drug is denied because it is not being used for a "medically accepted indication." Reviewed by: [email or web address], R.Ph  Please advise.

## 2021-02-10 NOTE — Telephone Encounter (Signed)
Please disregard. Patient is on IR dose (catapress) not ER (kapvay).

## 2021-02-12 NOTE — Progress Notes (Signed)
Office Visit Note  Patient: Stephen Butler             Date of Birth: 26-Apr-1950           MRN: [id number]             PCP: Susy Frizzle, MD Referring: Susy Frizzle, MD Visit Date: 02/13/2021 Occupation: Floor surfacing  Subjective:  New Patient (Initial Visit) (Bil restless leg, bil hip aching, bil shoulder aching)   History of Present Illness: Stephen Butler is a 70 y.o. male here for muscle pains and shoulder pain with elevated CK level. Symptoms are ongoing since this summer with pain in his legs and shoulders. Lab markers showed CK elevation over 300 and concern for statin induced myopathy so rosuvastatin was discontinued. He also received a 1 week prednisone taper for achilles tendon inflammation. Joint pains continued despite discontinuation of statin. His CK remained increased with additional elevation to 322 in September with ongoing shoulder pains. His shoulder and hip pains are bilateral and worse after working and worse when lying to his side at night. He describes knee pain that aches and improves when rubbing on affected side. The short prednisone treatment also had a large improvement in his other joints but not long lasting. He has some benefit with trial of muscle relaxant as needed.  Labs reviewed 12/2020 CK 422 ESR 2 CBC wnl BMP wnl   Activities of Daily Living:  Patient reports morning stiffness for 30 minutes.   Patient Reports nocturnal pain.  Difficulty dressing/grooming: Denies Difficulty climbing stairs: Reports Difficulty getting out of chair: Reports Difficulty using hands for taps, buttons, cutlery, and/or writing: Reports  Review of Systems  Constitutional:  Negative for fatigue.  HENT:  Negative for mouth dryness.   Eyes:  Negative for dryness.  Respiratory:  Negative for shortness of breath.   Cardiovascular:  Negative for swelling in legs/feet.  Gastrointestinal:  Negative for constipation.  Endocrine: Positive for cold intolerance  and heat intolerance.  Genitourinary:  Negative for difficulty urinating.  Musculoskeletal:  Positive for joint pain, joint pain, muscle weakness, morning stiffness and muscle tenderness.  Skin:  Negative for rash.  Allergic/Immunologic: Negative for susceptible to infections.  Neurological:  Positive for numbness.  Hematological:  Negative for bruising/bleeding tendency.  Psychiatric/Behavioral:  Positive for sleep disturbance.    PMFS History:  Patient Active Problem List   Diagnosis Date Noted   Elevated CK 02/13/2021   Bilateral shoulder pain 02/13/2021   Bilateral hip pain 02/13/2021   Pain in right knee 12/29/2019   Carotid stenosis 08/12/2018    Past Medical History:  Diagnosis Date   Arthritis    Epistaxis    GERD (gastroesophageal reflux disease)    Hematochezia    Hypertension    Microscopic hematuria    Neuromuscular disorder (HCC)    neuropathy   Stenosis of right carotid artery greater than 50%     Family History  Problem Relation Age of Onset   Hypertension Mother    Heart attack Mother    Stroke Father    Colon polyps Neg Hx    Colon cancer Neg Hx    Esophageal cancer Neg Hx    Rectal cancer Neg Hx    Stomach cancer Neg Hx    Past Surgical History:  Procedure Laterality Date   COLONOSCOPY     SHOULDER SURGERY     Social History   Social History Narrative   Not on file   Immunization History  Administered Date(s) Administered   Fluad Quad(high Dose 65+) 01/06/2021   PFIZER(Purple Top)SARS-COV-2 Vaccination 06/25/2019, 07/20/2019   Pneumococcal Conjugate-13 06/13/2020   Pneumococcal Polysaccharide-23 07/02/2017     Objective: Vital Signs: BP (!) 146/77 (BP Location: Right Arm, Patient Position: Sitting, Cuff Size: Normal)   Pulse 73   Resp 16   Ht '5\' 8"'  (1.727 m)   Wt 229 lb (103.9 kg)   BMI 34.82 kg/m    Physical Exam HENT:     Right Ear: External ear normal.     Left Ear: External ear normal.  Eyes:     Conjunctiva/sclera:  Conjunctivae normal.  Cardiovascular:     Rate and Rhythm: Normal rate and regular rhythm.  Pulmonary:     Effort: Pulmonary effort is normal.     Breath sounds: Normal breath sounds.  Musculoskeletal:     Right lower leg: No edema.     Left lower leg: No edema.  Skin:    General: Skin is warm and dry.     Findings: No rash.  Neurological:     General: No focal deficit present.     Mental Status: He is alert.     Motor: No weakness.     Deep Tendon Reflexes: Reflexes normal.  Psychiatric:        Mood and Affect: Mood normal.     Musculoskeletal Exam:  Neck full ROM no tenderness Shoulders full ROM, bilateral pain with overhead abduction, some radiation of pain to arms Elbows full ROM tenderness to pressure around lateral epicondyle, negative tinels of cubital tunnel Wrists full ROM no tenderness or swelling Fingers full ROM no tenderness or swelling Knees full ROM bilateral patellofemoral crepitus, no effusions Ankles full ROM no tenderness or swelling   Investigation: No additional findings.  Imaging: CT Head Wo Contrast  Result Date: 02/07/2021 CLINICAL DATA:  69 year old male with hypertension and sensation of room spinning. Suspected intracerebral hemorrhage. EXAM: CT HEAD WITHOUT CONTRAST TECHNIQUE: Contiguous axial images were obtained from the base of the skull through the vertex without intravenous contrast. COMPARISON:  No priors. FINDINGS: Brain: No evidence of acute infarction, hemorrhage, hydrocephalus, extra-axial collection or mass lesion/mass effect. Vascular: No hyperdense vessel or unexpected calcification. Skull: Normal. Negative for fracture or focal lesion. Sinuses/Orbits: No acute finding. Other: None. IMPRESSION: 1. No acute intracranial abnormalities. The appearance of the brain is normal. Electronically Signed   By: Vinnie Langton M.D.   On: 02/07/2021 05:38   MR Brain Wo Contrast (neuro protocol)  Result Date: 02/07/2021 CLINICAL DATA:  Dizziness,  non-specific EXAM: MRI HEAD WITHOUT CONTRAST TECHNIQUE: Multiplanar, multiecho pulse sequences of the brain and surrounding structures were obtained without intravenous contrast. COMPARISON:  March 2020 FINDINGS: Brain: There is no acute infarction or intracranial hemorrhage. There is no intracranial mass effect or edema. A focus of susceptibility in the left frontal white matter likely reflects chronic microhemorrhage or occult cavernous malformation. Minimal additional foci of susceptibility in the cerebral subcortical white matter probably reflect chronic microhemorrhages. Very small chronic left cerebellar infarct. Minimal foci of T2 hyperintensity in the supratentorial white matter are nonspecific but may reflect minor chronic microvascular ischemic changes. There is no hydrocephalus or extra-axial fluid collection. Ventricles and sulci are normal in size and configuration. Vascular: Major vessel flow voids at the skull base are preserved. Skull and upper cervical spine: Normal marrow signal is preserved. Sinuses/Orbits: Left maxillary sinus retention cyst or polyp. Orbits are unremarkable. Other: Sella is unremarkable.  Minimal mastoid fluid opacification. IMPRESSION: No  evidence of recent infarction, hemorrhage, or mass. Stable chronic findings detailed above. Electronically Signed   By: Macy Mis M.D.   On: 02/07/2021 13:24    Recent Labs: Lab Results  Component Value Date   WBC 6.4 02/07/2021   HGB 13.3 02/07/2021   PLT 200 02/07/2021   NA 140 02/07/2021   K 3.7 02/07/2021   CL 107 02/07/2021   CO2 28 02/07/2021   GLUCOSE 105 (H) 02/07/2021   BUN 13 02/07/2021   CREATININE 0.96 02/07/2021   BILITOT 0.5 01/06/2021   ALKPHOS 140 (H) 05/08/2017   AST 26 01/06/2021   ALT 29 01/06/2021   PROT 7.3 01/06/2021   ALBUMIN 4.0 05/08/2017   CALCIUM 9.2 02/07/2021   GFRAA 85 06/13/2020    Speciality Comments: No specialty comments available.  Procedures:  No procedures  performed Allergies: Patient has no known allergies.   Assessment / Plan:     Visit Diagnoses: Elevated CK - Plan: CK, Myositis Assessr Plus Jo-1 Autoabs, Anti-HMGCR Ab IgG, C-reactive protein  Mild elevation and might be around a baseline for him with relatively large muscle bulk and physically active. Will repeat CK and checking for myositis antibodies including HMGCR Abs for statin induced immune mediated myopathy. Lower pretest suspicion overall with his intact strength and more of joint pain symptoms at this time.  Chronic pain of both shoulders Bilateral hip pain  Joint pains no inflammatory appearing changes seen and no markers for PMR. He does not have any appreciable strength loss or atrophy in affected areas, may be some degenerative arthritis related symptoms.  Orders: Orders Placed This Encounter  Procedures   CK   Myositis Assessr Plus Jo-1 Autoabs   Anti-HMGCR Ab IgG   C-reactive protein   3-Hydroxy-3-Methylglutaryl-Coenzyme A Reductase (HMGCR) AB (IgG)    No orders of the defined types were placed in this encounter.   Follow-Up Instructions: Return in about 2 weeks (around 02/27/2021) for New pt ?CK/OA f/u 2wks.   Collier Salina, MD  Note - This record has been created using [employer].  Chart creation errors have been sought, but may not always  have been located. Such creation errors do not reflect on  the standard of medical care.

## 2021-02-13 ENCOUNTER — Ambulatory Visit: Payer: Medicare Other | Admitting: Internal Medicine

## 2021-02-13 ENCOUNTER — Encounter: Payer: Self-pay | Admitting: Internal Medicine

## 2021-02-13 ENCOUNTER — Other Ambulatory Visit: Payer: Self-pay

## 2021-02-13 VITALS — BP 146/77 | HR 73 | Resp 16 | Ht 68.0 in | Wt 229.0 lb

## 2021-02-13 DIAGNOSIS — M25551 Pain in right hip: Secondary | ICD-10-CM | POA: Diagnosis not present

## 2021-02-13 DIAGNOSIS — R748 Abnormal levels of other serum enzymes: Secondary | ICD-10-CM

## 2021-02-13 DIAGNOSIS — M25552 Pain in left hip: Secondary | ICD-10-CM

## 2021-02-13 DIAGNOSIS — M25511 Pain in right shoulder: Secondary | ICD-10-CM

## 2021-02-13 DIAGNOSIS — G8929 Other chronic pain: Secondary | ICD-10-CM | POA: Diagnosis not present

## 2021-02-13 DIAGNOSIS — M25512 Pain in left shoulder: Secondary | ICD-10-CM | POA: Diagnosis not present

## 2021-02-23 LAB — MYOSITIS ASSESSR PLUS JO-1 AUTOABS
EJ Autoabs: NOT DETECTED
Jo-1 Autoabs: <1 AI
Ku Autoabs: NOT DETECTED
Mi-2 Autoabs: NOT DETECTED
OJ Autoabs: NOT DETECTED
PL-12 Autoabs: NOT DETECTED
PL-7 Autoabs: NOT DETECTED
SRP Autoabs: NOT DETECTED

## 2021-02-23 LAB — C-REACTIVE PROTEIN: CRP: 4.3 mg/L (ref <8.0)

## 2021-02-23 LAB — HMGCR AB (IGG): HMGCR AB (IGG): <2 CU (ref <20)

## 2021-02-23 LAB — CK: Total CK: 364 U/L — ABNORMAL HIGH (ref 44–196)

## 2021-02-26 NOTE — Progress Notes (Signed)
Office Visit Note  Patient: Stephen Butler             Date of Birth: [birth year]           MRN: [id number]             PCP: Susy Frizzle, MD Referring: Susy Frizzle, MD Visit Date: 02/27/2021   Subjective:  Follow-up (Doing good)   History of Present Illness: Stephen Butler is a 70 y.o. male here for follow up with muscle pains and shoulder pain with persistent elevated CK level. Lab testing at initial visit was negative for myositis specific antibodies or HMGCR Abs. CK remained elevated at 364 within the range of prior levels. Overall he feels pretty well today his pain persistent issue has been more in the hips and knees.  Previous HPI 02/13/21 Stephen Butler is a 70 y.o. male here for muscle pains and shoulder pain with elevated CK level. Symptoms are ongoing since this summer with pain in his legs and shoulders. Lab markers showed CK elevation over 300 and concern for statin induced myopathy so rosuvastatin was discontinued. He also received a 1 week prednisone taper for achilles tendon inflammation. Joint pains continued despite discontinuation of statin. His CK remained increased with additional elevation to 322 in September with ongoing shoulder pains. His shoulder and hip pains are bilateral and worse after working and worse when lying to his side at night. He describes knee pain that aches and improves when rubbing on affected side. The short prednisone treatment also had a large improvement in his other joints but not long lasting. He has some benefit with trial of muscle relaxant as needed.   Labs reviewed 12/2020 CK 422 ESR 2 CBC wnl BMP wnl   Review of Systems  Constitutional:  Negative for fatigue.  HENT:  Negative for mouth dryness.   Eyes:  Negative for dryness.  Respiratory:  Negative for shortness of breath.   Cardiovascular:  Negative for swelling in legs/feet.  Gastrointestinal:  Negative for constipation.  Endocrine: Positive for cold intolerance  and heat intolerance.  Genitourinary:  Negative for difficulty urinating.  Musculoskeletal:  Positive for joint pain, joint pain and morning stiffness.  Skin:  Negative for rash.  Allergic/Immunologic: Negative for susceptible to infections.  Neurological:  Positive for numbness.  Hematological:  Negative for bruising/bleeding tendency.  Psychiatric/Behavioral:  Positive for sleep disturbance.    PMFS History:  Patient Active Problem List   Diagnosis Date Noted   Pain in left knee 02/28/2021   Elevated CK 02/13/2021   Bilateral shoulder pain 02/13/2021   Bilateral hip pain 02/13/2021   Pain in right knee 12/29/2019   Carotid stenosis 08/12/2018    Past Medical History:  Diagnosis Date   Arthritis    Epistaxis    GERD (gastroesophageal reflux disease)    Hematochezia    Hypertension    Microscopic hematuria    Neuromuscular disorder (HCC)    neuropathy   Stenosis of right carotid artery greater than 50%     Family History  Problem Relation Age of Onset   Hypertension Mother    Heart attack Mother    Stroke Father    Colon polyps Neg Hx    Colon cancer Neg Hx    Esophageal cancer Neg Hx    Rectal cancer Neg Hx    Stomach cancer Neg Hx    Past Surgical History:  Procedure Laterality Date   COLONOSCOPY     SHOULDER SURGERY  Social History   Social History Narrative   Not on file   Immunization History  Administered Date(s) Administered   Fluad Quad(high Dose 65+) 01/06/2021   PFIZER(Purple Top)SARS-COV-2 Vaccination 06/25/2019, 07/20/2019   Pneumococcal Conjugate-13 06/13/2020   Pneumococcal Polysaccharide-23 07/02/2017     Objective: Vital Signs: BP (!) 151/76 (BP Location: Left Arm, Patient Position: Sitting, Cuff Size: Normal)   Pulse 71   Resp 16   Ht 5' 9.5" (1.765 m)   Wt 225 lb (102.1 kg)   BMI 32.75 kg/m    Physical Exam Cardiovascular:     Rate and Rhythm: Normal rate and regular rhythm.  Pulmonary:     Effort: Pulmonary effort is  normal.     Breath sounds: Normal breath sounds.  Musculoskeletal:     Right lower leg: No edema.     Left lower leg: No edema.  Skin:    General: Skin is warm and dry.     Findings: No rash.  Neurological:     Mental Status: He is alert.  Psychiatric:        Mood and Affect: Mood normal.     Musculoskeletal Exam:  Shoulders full ROM some pain with active abduction no palpable changes or tenderness to pressre Elbows full ROM no tenderness or swelling Wrists full ROM no tenderness or swelling Fingers full ROM no tenderness or swelling Left knee appears to have some mild laxity to valgus pressure, no palpable effusions, slightly decreased extension ROM bilaterally    Investigation: No additional findings.  Imaging: CT Head Wo Contrast  Result Date: 02/07/2021 CLINICAL DATA:  70 year old male with hypertension and sensation of room spinning. Suspected intracerebral hemorrhage. EXAM: CT HEAD WITHOUT CONTRAST TECHNIQUE: Contiguous axial images were obtained from the base of the skull through the vertex without intravenous contrast. COMPARISON:  No priors. FINDINGS: Brain: No evidence of acute infarction, hemorrhage, hydrocephalus, extra-axial collection or mass lesion/mass effect. Vascular: No hyperdense vessel or unexpected calcification. Skull: Normal. Negative for fracture or focal lesion. Sinuses/Orbits: No acute finding. Other: None. IMPRESSION: 1. No acute intracranial abnormalities. The appearance of the brain is normal. Electronically Signed   By: Vinnie Langton M.D.   On: 02/07/2021 05:38   MR Brain Wo Contrast (neuro protocol)  Result Date: 02/07/2021 CLINICAL DATA:  Dizziness, non-specific EXAM: MRI HEAD WITHOUT CONTRAST TECHNIQUE: Multiplanar, multiecho pulse sequences of the brain and surrounding structures were obtained without intravenous contrast. COMPARISON:  March 2020 FINDINGS: Brain: There is no acute infarction or intracranial hemorrhage. There is no intracranial  mass effect or edema. A focus of susceptibility in the left frontal white matter likely reflects chronic microhemorrhage or occult cavernous malformation. Minimal additional foci of susceptibility in the cerebral subcortical white matter probably reflect chronic microhemorrhages. Very small chronic left cerebellar infarct. Minimal foci of T2 hyperintensity in the supratentorial white matter are nonspecific but may reflect minor chronic microvascular ischemic changes. There is no hydrocephalus or extra-axial fluid collection. Ventricles and sulci are normal in size and configuration. Vascular: Major vessel flow voids at the skull base are preserved. Skull and upper cervical spine: Normal marrow signal is preserved. Sinuses/Orbits: Left maxillary sinus retention cyst or polyp. Orbits are unremarkable. Other: Sella is unremarkable.  Minimal mastoid fluid opacification. IMPRESSION: No evidence of recent infarction, hemorrhage, or mass. Stable chronic findings detailed above. Electronically Signed   By: Macy Mis M.D.   On: 02/07/2021 13:24    Recent Labs: Lab Results  Component Value Date   WBC 6.4 02/07/2021  HGB 13.3 02/07/2021   PLT 200 02/07/2021   NA 140 02/07/2021   K 3.7 02/07/2021   CL 107 02/07/2021   CO2 28 02/07/2021   GLUCOSE 105 (H) 02/07/2021   BUN 13 02/07/2021   CREATININE 0.96 02/07/2021   BILITOT 0.5 01/06/2021   ALKPHOS 140 (H) 05/08/2017   AST 26 01/06/2021   ALT 29 01/06/2021   PROT 7.3 01/06/2021   ALBUMIN 4.0 05/08/2017   CALCIUM 9.2 02/07/2021   GFRAA 85 06/13/2020    Speciality Comments: No specialty comments available.  Procedures:  No procedures performed Allergies: Patient has no known allergies.   Assessment / Plan:     Visit Diagnoses: Elevated CK  Abnormal to a lesser degree today, myositis antibody workup all negative. Overall I suspect this lab variation may be near baseline for him and the pain and stiffness more attributable to use and some joint  osteoarthritis.  Chronic pain of left knee  Left knee appears to have some laxity in addition to some regular osteoarthritis. We discussed options for pursuing additional imaging but no strong indication unless plan for procedure or surgery. Encouraged trial of a soft supporting knee brace to reduce swelling and injuries from instability. Also recommended he can use low doses of NSAIDs PRN for osteoarthritis management cautioned about taking with food, and risks of high dose use..   Orders: No orders of the defined types were placed in this encounter.  No orders of the defined types were placed in this encounter.    Follow-Up Instructions: No follow-ups on file.   Collier Salina, MD  Note - This record has been created using [employer].  Chart creation errors have been sought, but may not always  have been located. Such creation errors do not reflect on  the standard of medical care.

## 2021-02-27 ENCOUNTER — Other Ambulatory Visit: Payer: Self-pay

## 2021-02-27 ENCOUNTER — Encounter: Payer: Self-pay | Admitting: Internal Medicine

## 2021-02-27 ENCOUNTER — Ambulatory Visit: Payer: Medicare Other | Admitting: Internal Medicine

## 2021-02-27 VITALS — BP 151/76 | HR 71 | Resp 16 | Ht 69.5 in | Wt 225.0 lb

## 2021-02-27 DIAGNOSIS — R748 Abnormal levels of other serum enzymes: Secondary | ICD-10-CM

## 2021-02-27 DIAGNOSIS — M25551 Pain in right hip: Secondary | ICD-10-CM | POA: Diagnosis not present

## 2021-02-27 DIAGNOSIS — M25552 Pain in left hip: Secondary | ICD-10-CM

## 2021-02-27 DIAGNOSIS — M25562 Pain in left knee: Secondary | ICD-10-CM | POA: Diagnosis not present

## 2021-02-27 DIAGNOSIS — G8929 Other chronic pain: Secondary | ICD-10-CM | POA: Diagnosis not present

## 2021-02-27 NOTE — Patient Instructions (Signed)
Naproxen Immediate-Release Tablets What is this medication? NAPROXEN (na PROX en) treats mild to moderate pain, inflammation, and arthritis. It belongs to a group of medications called NSAIDS. This medicine may be used for other purposes; ask your health care provider or pharmacist if you have questions. COMMON BRAND NAME(S): Aflaxen, Aleve, Aleve Arthritis, All Day Pain Relief, All Day Relief, Anaprox, Anaprox DS, Naprosyn, [facility] Naproxen Sodium What should I tell my care team before I take this medication? They need to know if you have any of these conditions: Asthma (lung or breathing disease) Bleeding disorder Coronary artery bypass graft (CABG) within the past 2 weeks Heart attack Heart disease Heart failure High blood pressure High levels of potassium in the blood If you often drink alcohol Kidney disease Liver disease Low red blood cell counts Smoke tobacco cigarettes Stomach bleeding Stomach or intestine problems Take medications that treat or prevent blood clots Taking steroids such as dexamethasone or prednisone An unusual or allergic reaction to naproxen, other medications, foods, dyes, or preservatives Pregnant or trying to get pregnant Breast-feeding How should I use this medication? Take this medication by mouth with water. Take it as directed on the prescription label at the same time every day. Do not cut, crush or chew this medication. Swallow the tablets whole. You can take it with or without food. If it upsets your stomach, take it with food. Keep taking it unless your care team tells you to stop. A special MedGuide will be given to you by the pharmacist with each prescription and refill. Be sure to read this information carefully each time. Talk to your care team about the use of this medication in children. While it may be prescribed for to children for selected conditions, precautions do apply. Overdosage: If you think you have taken too much of this medicine  contact a poison control center or emergency room at once. NOTE: This medicine is only for you. Do not share this medicine with others. What if I miss a dose? If you miss a dose, take it as soon as you can. If it is almost time for your next dose, take only that dose. Do not take double or extra doses. What may interact with this medication? Alcohol Aspirin Cidofovir Diuretics Lithium Methotrexate Other medications for inflammation like ketorolac or prednisone Pemetrexed Probenecid Warfarin This list may not describe all possible interactions. Give your health care provider a list of all the medicines, herbs, non-prescription drugs, or dietary supplements you use. Also tell them if you smoke, drink alcohol, or use illegal drugs. Some items may interact with your medicine. What should I watch for while using this medication? Visit your care team for regular checks on your progress. Tell your care team if your symptoms do not start to get better or if they get worse. Do not take other medications that contain aspirin, ibuprofen, or naproxen with this medication. Side effects such as stomach upset, nausea, or ulcers may be more likely to occur. Many non-prescription medications contain aspirin, ibuprofen, or naproxen. Always read labels carefully. This medication can cause serious ulcers and bleeding in the stomach. It can happen with no warning. Smoking, drinking alcohol, older age, and poor health can also increase risks. Call your care team right away if you have stomach pain or blood in your vomit or stool. This medication does not prevent a heart attack or stroke. This medication may increase the chance of a heart attack or stroke. The chance may increase the longer you use  this medication or if you have heart disease. If you take aspirin to prevent a heart attack or stroke, talk to your care team about using this medication. Alcohol may interfere with the effect of this medication. Avoid  alcoholic drinks. This medication may cause serious skin reactions. They can happen weeks to months after starting the medication. Contact your care team right away if you notice fevers or flu-like symptoms with a rash. The rash may be red or purple and then turn into blisters or peeling of the skin. Or, you might notice a red rash with swelling of the face, lips or lymph nodes in your neck or under your arms. Talk to your care team if you are pregnant before taking this medication. Taking this medication between weeks 20 and 30 of pregnancy may harm your unborn baby. Your care team will monitor you closely if you need to take it. After 30 weeks of pregnancy, do not take this medication. You may get drowsy or dizzy. Do not drive, use machinery, or do anything that needs mental alertness until you know how this medication affects you. Do not stand up or sit up quickly, especially if you are an older patient. This reduces the risk of dizzy or fainting spells. Be careful brushing or flossing your teeth or using a toothpick because you may get an infection or bleed more easily. If you have any dental work done, tell your dentist you are receiving this medication. This medication may make it more difficult to get pregnant. Talk to your care team if you are concerned about your fertility. What side effects may I notice from receiving this medication? Side effects that you should report to your care team as soon as possible: Allergic reactions--skin rash, itching, hives, swelling of the face, lips, tongue, or throat Bleeding--bloody or black, tar-like stools, vomiting blood or brown material that looks like coffee grounds, red or dark brown urine, small red or purple spots on skin, unusual bruising or bleeding Heart attack--pain or tightness in the chest, shoulders, arms, or jaw, nausea, shortness of breath, cold or clammy skin, feeling faint or lightheaded Heart failure--shortness of breath, swelling of the  ankles, feet, or hands, sudden weight gain, unusual weakness or fatigue Increase in blood pressure Kidney injury--decrease in the amount of urine, swelling of the ankles, hands, or feet Liver injury--right upper belly pain, loss of appetite, nausea, light-colored stool, dark yellow or brown urine, yellowing skin or eyes, unusual weakness, fatigue Rash, fever, and swollen lymph nodes Redness, blistering, peeling, or loosening of the skin, including inside the mouth Stroke--sudden numbness or weakness of the face, arm, or leg, trouble speaking, confusion, trouble walking, loss of balance or coordination, dizziness, severe headache, change in vision Side effects that usually do not require medical attention (report to your care team if they continue or are bothersome): Headache Loss of appetite Nausea Upset stomach This list may not describe all possible side effects. Call your doctor for medical advice about side effects. You may report side effects to FDA at 1-800-FDA-1088. Where should I keep my medication? Keep out of the reach of children and pets. Store at room temperature between 20 and 25 degrees C (68 and 77 degrees F). Protect from moisture. Keep the container tightly closed. Avoid exposure to extreme heat. Get rid of any unused medication after the expiration date. To get rid of medications that are no longer needed or have expired: Take the medication to a medication take-back program. Check with your pharmacy  or law enforcement to find a location. If you cannot return the medication, check the label or package insert to see if the medication should be thrown out in the garbage or flushed down the toilet. If you are not sure, ask your care team. If it is safe to put it in the trash, empty the medication out of the container. Mix the medication with cat litter, dirt, coffee grounds, or other unwanted substance. Seal the mixture in a bag or container. Put it in the  trash.  Osteoarthritis Osteoarthritis is a type of arthritis. It refers to joint pain or joint disease. Osteoarthritis affects tissue that covers the ends of bones in joints (cartilage). Cartilage acts as a cushion between the bones and helps them move smoothly. Osteoarthritis occurs when cartilage in the joints gets worn down. Osteoarthritis is sometimes called "wear and tear" arthritis. Osteoarthritis is the most common form of arthritis. It often occurs in older people. It is a condition that gets worse over time. The joints most often affected by this condition are in the fingers, toes, hips, knees, and spine, including the neck and lower back. What are the causes? This condition is caused by the wearing down of cartilage that covers the ends of bones. What increases the risk? The following factors may make you more likely to develop this condition: Being age 3 or older. Obesity. Overuse of joints. Past injury of a joint. Past surgery on a joint. Family history of osteoarthritis. What are the signs or symptoms? The main symptoms of this condition are pain, swelling, and stiffness in the joint. Other symptoms may include: An enlarged joint. More pain and further damage caused by small pieces of bone or cartilage that break off and float inside of the joint. Small deposits of bone (osteophytes) that grow on the edges of the joint. A grating or scraping feeling inside the joint when you move it. Popping or creaking sounds when you move. Difficulty walking or exercising. An inability to grip items, twist your hand(s), or control the movements of your hands and fingers. How is this diagnosed? This condition may be diagnosed based on: Your medical history. A physical exam. Your symptoms. X-rays of the affected joint(s). Blood tests to rule out other types of arthritis. How is this treated? There is no cure for this condition, but treatment can help control pain and improve joint  function. Treatment may include a combination of therapies, such as: Pain relief techniques, such as: Applying heat and cold to the joint. Massage. A form of talk therapy called cognitive behavioral therapy (CBT). This therapy helps you set goals and follow up on the changes that you make. Medicines for pain and inflammation. The medicines can be taken by mouth or applied to the skin. They include: NSAIDs, such as ibuprofen. Prescription medicines. Strong anti-inflammatory medicines (corticosteroids). Certain nutritional supplements. A prescribed exercise program. You may work with a physical therapist. Assistive devices, such as a brace, wrap, splint, specialized glove, or cane. A weight control plan. Surgery, such as: An osteotomy. This is done to reposition the bones and relieve pain or to remove loose pieces of bone and cartilage. Joint replacement surgery. You may need this surgery if you have advanced osteoarthritis. Follow these instructions at home: Activity Rest your affected joints as told by your health care provider. Exercise as told by your health care provider. He or she may recommend specific types of exercise, such as: Strengthening exercises. These are done to strengthen the muscles that support  joints affected by arthritis. Aerobic activities. These are exercises, such as brisk walking or water aerobics, that increase your heart rate. Range-of-motion activities. These help your joints move more easily. Balance and agility exercises. Managing pain, stiffness, and swelling   If directed, apply heat to the affected area as often as told by your health care provider. Use the heat source that your health care provider recommends, such as a moist heat pack or a heating pad. If you have a removable assistive device, remove it as told by your health care provider. Place a towel between your skin and the heat source. If your health care provider tells you to keep the assistive  device on while you apply heat, place a towel between the assistive device and the heat source. Leave the heat on for 20-30 minutes. Remove the heat if your skin turns bright red. This is especially important if you are unable to feel pain, heat, or cold. You may have a greater risk of getting burned. If directed, put ice on the affected area. To do this: If you have a removable assistive device, remove it as told by your health care provider. Put ice in a plastic bag. Place a towel between your skin and the bag. If your health care provider tells you to keep the assistive device on during icing, place a towel between the assistive device and the bag. Leave the ice on for 20 minutes, 2-3 times a day. Move your fingers or toes often to reduce stiffness and swelling. Raise (elevate) the injured area above the level of your heart while you are sitting or lying down. General instructions Take over-the-counter and prescription medicines only as told by your health care provider. Maintain a healthy weight. Follow instructions from your health care provider for weight control. Do not use any products that contain nicotine or tobacco, such as cigarettes, e-cigarettes, and chewing tobacco. If you need help quitting, ask your health care provider. Use assistive devices as told by your health care provider. Keep all follow-up visits as told by your health care provider. This is important. Where to find more information [employer] of Arthritis and Musculoskeletal and Skin Diseases: www.niams.SouthExposed.es [employer] on Aging: http://kim-miller.com/ American College of Rheumatology: www.rheumatology.org Contact a health care provider if: You have redness, swelling, or a feeling of warmth in a joint that gets worse. You have a fever along with joint or muscle aches. You develop a rash. You have trouble doing your normal activities. Get help right away if: You have pain that gets worse and is not  relieved by pain medicine. Summary Osteoarthritis is a type of arthritis that affects tissue covering the ends of bones in joints (cartilage). This condition is caused by the wearing down of cartilage that covers the ends of bones. The main symptom of this condition is pain, swelling, and stiffness in the joint. There is no cure for this condition, but treatment can help control pain and improve joint function.

## 2021-02-28 DIAGNOSIS — M25562 Pain in left knee: Secondary | ICD-10-CM | POA: Insufficient documentation

## 2021-04-05 DIAGNOSIS — H524 Presbyopia: Secondary | ICD-10-CM | POA: Diagnosis not present

## 2021-04-05 DIAGNOSIS — H04123 Dry eye syndrome of bilateral lacrimal glands: Secondary | ICD-10-CM | POA: Diagnosis not present

## 2021-04-05 DIAGNOSIS — H2513 Age-related nuclear cataract, bilateral: Secondary | ICD-10-CM | POA: Diagnosis not present

## 2021-04-05 DIAGNOSIS — H52223 Regular astigmatism, bilateral: Secondary | ICD-10-CM | POA: Diagnosis not present

## 2021-05-23 DIAGNOSIS — S46012A Strain of muscle(s) and tendon(s) of the rotator cuff of left shoulder, initial encounter: Secondary | ICD-10-CM | POA: Diagnosis not present

## 2021-05-23 DIAGNOSIS — S46011A Strain of muscle(s) and tendon(s) of the rotator cuff of right shoulder, initial encounter: Secondary | ICD-10-CM | POA: Diagnosis not present

## 2021-05-23 DIAGNOSIS — M542 Cervicalgia: Secondary | ICD-10-CM | POA: Diagnosis not present

## 2021-06-02 ENCOUNTER — Other Ambulatory Visit: Payer: Self-pay | Admitting: Family Medicine

## 2021-06-05 ENCOUNTER — Other Ambulatory Visit: Payer: Self-pay

## 2021-06-12 ENCOUNTER — Ambulatory Visit
Admission: RE | Admit: 2021-06-12 | Discharge: 2021-06-12 | Disposition: A | Discharge status: Home / Self Care | Payer: Medicare PPO | Source: Ambulatory Visit | Attending: Family Medicine | Admitting: Family Medicine

## 2021-06-12 ENCOUNTER — Ambulatory Visit (INDEPENDENT_AMBULATORY_CARE_PROVIDER_SITE_OTHER): Payer: Medicare PPO | Admitting: Family Medicine

## 2021-06-12 ENCOUNTER — Other Ambulatory Visit: Payer: Self-pay | Admitting: Family Medicine

## 2021-06-12 ENCOUNTER — Encounter: Payer: Self-pay | Admitting: Family Medicine

## 2021-06-12 ENCOUNTER — Other Ambulatory Visit: Payer: Self-pay

## 2021-06-12 VITALS — BP 138/78 | HR 75 | Temp 97.3°F | Resp 18 | Ht 69.5 in | Wt 220.0 lb

## 2021-06-12 DIAGNOSIS — E7800 Pure hypercholesterolemia, unspecified: Secondary | ICD-10-CM

## 2021-06-12 DIAGNOSIS — I6521 Occlusion and stenosis of right carotid artery: Secondary | ICD-10-CM | POA: Diagnosis not present

## 2021-06-12 DIAGNOSIS — M7918 Myalgia, other site: Secondary | ICD-10-CM

## 2021-06-12 DIAGNOSIS — I10 Essential (primary) hypertension: Secondary | ICD-10-CM | POA: Diagnosis not present

## 2021-06-12 DIAGNOSIS — R0781 Pleurodynia: Secondary | ICD-10-CM | POA: Diagnosis not present

## 2021-06-12 DIAGNOSIS — R0789 Other chest pain: Secondary | ICD-10-CM

## 2021-06-12 MED ORDER — TADALAFIL 20 MG PO TABS: 10.0000 mg | ORAL_TABLET | ORAL | 11 refills | Status: DC | PRN

## 2021-06-12 NOTE — Progress Notes (Signed)
Subjective:    Patient ID: Stephen Butler, male    DOB: [birth year], 71 y.o.   MRN: [id number]  HPI  01/06/21 Please see last visit.  Patient had severe myalgias all over his body.  He was found to have an elevated CK level greater than 300.  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  We held his Crestor.  The majority of his myalgias have improved although he continues to complain of aching and throbbing pain in both shoulders.  He definitely states that he is doing better than previously when he was taking the Crestor.  Unfortunately he has asymptomatic right carotid artery stenosis with a 40 to 69% blockage in the right internal carotid artery seen on his most recent carotid Doppler.  He would benefit from taking a statin.  He is also not taking an aspirin.  At that time, my plan was:  Given the muscle aches in his shoulder, I suspect arthritis.  However I would like to obtain a CK level to ensure resolution of the statin induced myopathy.  I would also check a sed rate to rule out polymyalgia rheumatica.  If lab work is normal, I would recommend the patient start Lipitor 10 mg a day in an effort to get him on a statin due to his carotid artery stenosis and treat the shoulder pain is osteoarthritis.  If his CK level is still elevated or his sed rate is extremely high, I would recommend rheumatology consultation for possible myositis versus polymyalgia rheumatica.  Patient needs to be on a statin if tolerated.  If not I would use Repatha.  Strongly encourage the patient to take an aspirin 81 mg daily.    I referred the patient to rheumatology and included their A/P below from ov in 11/22: "Assessment / Plan:     Visit Diagnoses: Elevated CK  Abnormal to a lesser degree today, myositis antibody workup all negative. Overall I suspect this lab variation may be near baseline for him and the pain and stiffness more attributable to use and some joint osteoarthritis."  06/12/21 Patient presents with several  months of left-sided abdominal pain and lower chest pain.  He points to his ribs in the midaxillary line as well as an area below the ribs.  He states that he is tender to palpation in that area.  It hurts to sleep on that area.  It radiates front to back.  However he also reports pain after he eats.  If he eats that area feels full tender and swollen.  He is on Protonix.  He denies any melena.  He still occasionally sees bright red blood per rectum but he had a colonoscopy last year that only showed 1 polyp.  He denies any melena or nausea or vomiting or hematemesis.  Palpation can reproduce the pain.  He is not taking any cholesterol medication due to his elevated CK level however work-up was normal. Past Medical History:  Diagnosis Date   Arthritis    Epistaxis    GERD (gastroesophageal reflux disease)    Hematochezia    Hypertension    Microscopic hematuria    Neuromuscular disorder (HCC)    neuropathy   Stenosis of right carotid artery greater than 50%    Past Surgical History:  Procedure Laterality Date   COLONOSCOPY     SHOULDER SURGERY     Current Outpatient Medications on File Prior to Visit  Medication Sig Dispense Refill   amLODipine (NORVASC) 10 MG tablet Take 1 tablet by  mouth once daily 90 tablet 3   atorvastatin (LIPITOR) 10 MG tablet Take 1 tablet (10 mg total) by mouth daily. 90 tablet 3   cloNIDine (CATAPRES) 0.1 MG tablet Take 1 tablet by mouth twice daily 180 tablet 3   gabapentin (NEURONTIN) 300 MG capsule Take 300 mg by mouth daily as needed.     hydrochlorothiazide (HYDRODIURIL) 25 MG tablet Take 1 tablet by mouth once daily 90 tablet 0   losartan (COZAAR) 100 MG tablet Take 1 tablet (100 mg total) by mouth daily. Stop LISINOPRIL 90 tablet 3   meclizine (ANTIVERT) 25 MG tablet Take 1 tablet (25 mg total) by mouth 3 (three) times daily as needed for dizziness. (Patient not taking: Reported on 02/27/2021) 15 tablet 0   meloxicam (MOBIC) 15 MG tablet Take by mouth.      naproxen (NAPROSYN) 500 MG tablet Take 1 tablet (500 mg total) by mouth 2 (two) times daily with a meal. 30 tablet 1   pantoprazole (PROTONIX) 40 MG tablet Take 1 tablet by mouth once daily 90 tablet 3   sildenafil (VIAGRA) 100 MG tablet TAKE 1/2 TO 1 (ONE-HALF TO ONE) TABLET BY MOUTH ONCE DAILY AS NEEDED FOR ERECTILE DYSFUNCTION 11 tablet 0   tizanidine (ZANAFLEX) 2 MG capsule Take by mouth.     valACYclovir (VALTREX) 1000 MG tablet Take 1 tablet (1,000 mg total) by mouth 2 (two) times daily. Take 1 tablet twice daily for 7-10 days within 1 day of symptom onset. 60 tablet 1   No current facility-administered medications on file prior to visit.    No Known Allergies Social History   Socioeconomic History   Marital status: Married    Spouse name: Not on file   Number of children: 0   Years of education: Not on file   Highest education level: Not on file  Occupational History   Occupation: retired  Tobacco Use   Smoking status: Never   Smokeless tobacco: Never  Vaping Use   Vaping Use: Never used  Substance and Sexual Activity   Alcohol use: No   Drug use: No   Sexual activity: Yes    Partners: Female  Other Topics Concern   Not on file  Social History Narrative   Not on file   Social Determinants of Health   Financial Resource Strain: Low Risk    Difficulty of Paying Living Expenses: Not hard at all  Food Insecurity: No Food Insecurity   Worried About Charity fundraiser in the Last Year: Never true   [place] in the Last Year: Never true  Transportation Needs: No Transportation Needs   Lack of Transportation (Medical): No   Lack of Transportation (Non-Medical): No  Physical Activity: Sufficiently Active   Days of Exercise per Week: 5 days   Minutes of Exercise per Session: 60 min  Stress: Stress Concern Present   Feeling of Stress : To some extent  Social Connections: Socially Integrated   Frequency of Communication with Friends and Family: Three times a week    Frequency of Social Gatherings with Friends and Family: Once a week   Attends Religious Services: More than 4 times per year   Active Member of [employer] or Organizations: Yes   Attends Music therapist: More than 4 times per year   Marital Status: Married  Human resources officer Violence: Not At Risk   Fear of Current or Ex-Partner: No   Emotionally Abused: No   Physically Abused: No  Sexually Abused: No   Family History  Problem Relation Age of Onset   Hypertension Mother    Heart attack Mother    Stroke Father    Colon polyps Neg Hx    Colon cancer Neg Hx    Esophageal cancer Neg Hx    Rectal cancer Neg Hx    Stomach cancer Neg Hx      Review of Systems  All other systems reviewed and are negative.     Objective:   Physical Exam Vitals reviewed.  Constitutional:      General: He is not in acute distress.    Appearance: Normal appearance. He is well-developed and normal weight. He is not ill-appearing, toxic-appearing or diaphoretic.  HENT:     Head: Normocephalic and atraumatic.     Right Ear: Tympanic membrane and ear canal normal.     Left Ear: Tympanic membrane and ear canal normal.     Nose: Nose normal. No congestion or rhinorrhea.     Mouth/Throat:     Mouth: Mucous membranes are moist.     Pharynx: Oropharynx is clear. No oropharyngeal exudate or posterior oropharyngeal erythema.  Eyes:     Extraocular Movements: Extraocular movements intact.     Conjunctiva/sclera: Conjunctivae normal.     Pupils: Pupils are equal, round, and reactive to light.  Neck:     Vascular: No carotid bruit.  Cardiovascular:     Rate and Rhythm: Normal rate and regular rhythm.     Heart sounds: Normal heart sounds. No murmur heard.   No friction rub. No gallop.  Pulmonary:     Effort: Pulmonary effort is normal. No respiratory distress.     Breath sounds: Normal breath sounds. No stridor. No wheezing, rhonchi or rales.    Chest:     Chest wall: Tenderness present. No  mass, deformity, swelling or edema.    Abdominal:     General: Bowel sounds are normal. There is no distension.     Palpations: Abdomen is soft.     Tenderness: There is no abdominal tenderness. There is no guarding or rebound.  Musculoskeletal:        General: No swelling, tenderness, deformity or signs of injury.     Cervical back: Neck supple.     Right lower leg: No edema.     Left lower leg: No edema.  Lymphadenopathy:     Cervical: No cervical adenopathy.  Skin:    Coloration: Skin is not jaundiced.     Findings: No bruising, erythema, lesion or rash.  Neurological:     General: No focal deficit present.     Mental Status: He is alert and oriented to person, place, and time. Mental status is at baseline.     Cranial Nerves: No cranial nerve deficit.     Sensory: No sensory deficit.     Motor: No weakness or abnormal muscle tone.     Coordination: Coordination normal.     Gait: Gait normal.     Deep Tendon Reflexes: Reflexes are normal and symmetric.  Psychiatric:        Mood and Affect: Mood normal.        Behavior: Behavior normal.        Thought Content: Thought content normal.        Judgment: Judgment normal.          Assessment & Plan:  Rib pain on left side - Plan: CANCELED: DG Ribs Unilateral Left  Pure hypercholesterolemia - Plan: CBC  with Differential/Platelet, Lipid panel, COMPLETE METABOLIC PANEL WITH GFR  Musculoskeletal pain  Stenosis of right carotid artery  Benign essential HTN Blood pressure today is excellent.  Given his known history of right carotid artery stenosis I would like his LDL cholesterol to be below 70.  I will check a fasting lipid panel and if greater than 70, I will likely start the patient back on Lipitor given the fact his CK levels were persistently elevated despite coming off statin and work-up for elevated CK levels by rheumatology was normal.  The pain on his left side seems musculoskeletal.  Begin by obtaining an x-ray of the  ribs to evaluate further.

## 2021-06-13 LAB — CBC WITH DIFFERENTIAL/PLATELET
Absolute Monocytes: 740 cells/uL (ref 200–950)
Basophils Absolute: 17 cells/uL (ref 0–200)
Basophils Relative: 0.2 %
Eosinophils Absolute: 43 cells/uL (ref 15–500)
Eosinophils Relative: 0.5 %
HCT: 44.1 % (ref 38.5–50.0)
Hemoglobin: 14.3 g/dL (ref 13.2–17.1)
Lymphs Abs: 3302 cells/uL (ref 850–3900)
MCH: 26.7 pg — ABNORMAL LOW (ref 27.0–33.0)
MCHC: 32.4 g/dL (ref 32.0–36.0)
MCV: 82.4 fL (ref 80.0–100.0)
MPV: 10.8 fL (ref 7.5–12.5)
Monocytes Relative: 8.6 %
Neutro Abs: 4498 cells/uL (ref 1500–7800)
Neutrophils Relative %: 52.3 %
Platelets: 181 10*3/uL (ref 140–400)
RBC: 5.35 10*6/uL (ref 4.20–5.80)
RDW: 15.2 % — ABNORMAL HIGH (ref 11.0–15.0)
Total Lymphocyte: 38.4 %
WBC: 8.6 10*3/uL (ref 3.8–10.8)

## 2021-06-13 LAB — COMPLETE METABOLIC PANEL WITH GFR
AG Ratio: 1.2 (calc) (ref 1.0–2.5)
ALT: 27 U/L (ref 9–46)
AST: 18 U/L (ref 10–35)
Albumin: 4.1 g/dL (ref 3.6–5.1)
Alkaline phosphatase (APISO): 151 U/L — ABNORMAL HIGH (ref 35–144)
BUN: 17 mg/dL (ref 7–25)
CO2: 29 mmol/L (ref 20–32)
Calcium: 9.6 mg/dL (ref 8.6–10.3)
Chloride: 101 mmol/L (ref 98–110)
Creat: 1.23 mg/dL (ref 0.70–1.35)
Globulin: 3.3 g/dL (calc) (ref 1.9–3.7)
Glucose, Bld: 98 mg/dL (ref 65–99)
Potassium: 4.6 mmol/L (ref 3.5–5.3)
Sodium: 138 mmol/L (ref 135–146)
Total Bilirubin: 0.6 mg/dL (ref 0.2–1.2)
Total Protein: 7.4 g/dL (ref 6.1–8.1)
eGFR: 64 mL/min/{1.73_m2} (ref >=60)

## 2021-06-13 LAB — LIPID PANEL
Cholesterol: 232 mg/dL — ABNORMAL HIGH (ref <200)
HDL: 82 mg/dL (ref >=40)
LDL Cholesterol (Calc): 134 mg/dL (calc) — ABNORMAL HIGH
Non-HDL Cholesterol (Calc): 150 mg/dL (calc) — ABNORMAL HIGH (ref <130)
Total CHOL/HDL Ratio: 2.8 (calc) (ref <5.0)
Triglycerides: 67 mg/dL (ref <150)

## 2021-06-15 ENCOUNTER — Other Ambulatory Visit: Payer: Self-pay | Admitting: Family Medicine

## 2021-06-15 MED ORDER — PREDNISONE 20 MG PO TABS: ORAL_TABLET | ORAL | 0 refills | Status: DC

## 2021-06-16 ENCOUNTER — Other Ambulatory Visit: Payer: Self-pay

## 2021-06-16 MED ORDER — ROSUVASTATIN CALCIUM 10 MG PO TABS: 10.0000 mg | ORAL_TABLET | Freq: Every day | ORAL | 3 refills | Status: DC

## 2021-07-14 ENCOUNTER — Ambulatory Visit (INDEPENDENT_AMBULATORY_CARE_PROVIDER_SITE_OTHER): Payer: Medicare PPO | Admitting: Family Medicine

## 2021-07-14 VITALS — BP 142/76 | HR 78 | Temp 97.4°F | Ht 69.0 in | Wt 223.2 lb

## 2021-07-14 DIAGNOSIS — I6523 Occlusion and stenosis of bilateral carotid arteries: Secondary | ICD-10-CM

## 2021-07-14 DIAGNOSIS — R42 Dizziness and giddiness: Secondary | ICD-10-CM | POA: Diagnosis not present

## 2021-07-14 MED ORDER — HYDROCHLOROTHIAZIDE 25 MG PO TABS: 25.0000 mg | ORAL_TABLET | Freq: Every day | ORAL | 3 refills | Status: DC

## 2021-07-14 NOTE — Progress Notes (Signed)
? ?Subjective:  ? ? Patient ID: Stephen Butler, male    DOB: [birth year], 71 y.o.   MRN: [id number] ? ?HPI  ?Carotid dopplers 2020- ?IMPRESSION: ?1. Moderate amount right-sided atherosclerotic plaque results in ?elevated peak systolic velocities within the right internal carotid ?artery compatible with the 50-69% luminal narrowing range. Further ?evaluation with CTA could be performed as clinically indicated. ?2. Large amount of left-sided atherosclerotic plaque, not definitely ?resulting in a hemodynamically significant stenosis. ? ?Patient states that recently, he has been getting lightheaded.  He states that if he looks up or if he turns his head to the side, he will feel lightheaded.  I pressed him on whether he is having vertigo versus lightheadedness like he may pass out.  Patient denies any vertigo.  He states that he feels like he may pass out but he turns his head quickly to the side to talk to someone or if he looks up to the do work over his head.  He denies any room spinning or motion or disequilibrium.  Instead of the lightheadedness presyncopal feeling.  He denies any palpitations or irregular heartbeats or chest pain or shortness of breath.  His right-sided carotid bruit sounds worse ?  ?Past Medical History:  ?Diagnosis Date  ? Arthritis   ? Epistaxis   ? GERD (gastroesophageal reflux disease)   ? Hematochezia   ? Hypertension   ? Microscopic hematuria   ? Neuromuscular disorder ([place])   ? neuropathy  ? Stenosis of right carotid artery greater than 50%   ? ?Past Surgical History:  ?Procedure Laterality Date  ? COLONOSCOPY    ? SHOULDER SURGERY    ? ?Current Outpatient Medications on File Prior to Visit  ?Medication Sig Dispense Refill  ? amLODipine (NORVASC) 10 MG tablet Take 1 tablet by mouth once daily 90 tablet 3  ? cloNIDine (CATAPRES) 0.1 MG tablet Take 1 tablet by mouth twice daily 180 tablet 3  ? hydrochlorothiazide (HYDRODIURIL) 25 MG tablet Take 1 tablet by mouth once daily 90 tablet 0  ?  losartan (COZAAR) 100 MG tablet Take 1 tablet (100 mg total) by mouth daily. Stop LISINOPRIL 90 tablet 3  ? meloxicam (MOBIC) 15 MG tablet Take by mouth.    ? pantoprazole (PROTONIX) 40 MG tablet Take 1 tablet by mouth once daily 90 tablet 3  ? predniSONE (DELTASONE) 20 MG tablet 3 tabs poqday 1-2, 2 tabs poqday 3-4, 1 tab poqday 5-6 12 tablet 0  ? rosuvastatin (CRESTOR) 10 MG tablet Take 1 tablet (10 mg total) by mouth daily. 90 tablet 3  ? sildenafil (VIAGRA) 100 MG tablet TAKE 1/2 TO 1 (ONE-HALF TO ONE) TABLET BY MOUTH ONCE DAILY AS NEEDED FOR ERECTILE DYSFUNCTION 11 tablet 0  ? tadalafil (CIALIS) 20 MG tablet Take 0.5-1 tablets (10-20 mg total) by mouth every other day as needed for erectile dysfunction. 10 tablet 11  ? ?No current facility-administered medications on file prior to visit.  ? ? ?No Known Allergies ?Social History  ? ?Socioeconomic History  ? Marital status: Married  ?  Spouse name: Not on file  ? Number of children: 0  ? Years of education: Not on file  ? Highest education level: Not on file  ?Occupational History  ? Occupation: retired  ?Tobacco Use  ? Smoking status: Never  ? Smokeless tobacco: Never  ?Vaping Use  ? Vaping Use: Never used  ?Substance and Sexual Activity  ? Alcohol use: No  ? Drug use: No  ? Sexual activity: Yes  ?  Partners: Female  ?Other Topics Concern  ? Not on file  ?Social History Narrative  ? Not on file  ? ?Social Determinants of Health  ? ?Financial Resource Strain: Low Risk   ? Difficulty of Paying Living Expenses: Not hard at all  ?Food Insecurity: No Food Insecurity  ? Worried About Charity fundraiser in the Last Year: Never true  ? Ran Out of Food in the Last Year: Never true  ?Transportation Needs: No Transportation Needs  ? Lack of Transportation (Medical): No  ? Lack of Transportation (Non-Medical): No  ?Physical Activity: Sufficiently Active  ? Days of Exercise per Week: 5 days  ? Minutes of Exercise per Session: 60 min  ?Stress: Stress Concern Present  ?  Feeling of Stress : To some extent  ?Social Connections: Socially Integrated  ? Frequency of Communication with Friends and Family: Three times a week  ? Frequency of Social Gatherings with Friends and Family: Once a week  ? Attends Religious Services: More than 4 times per year  ? Active Member of Clubs or Organizations: Yes  ? Attends Archivist Meetings: More than 4 times per year  ? Marital Status: Married  ?Intimate Partner Violence: Not At Risk  ? Fear of Current or Ex-Partner: No  ? Emotionally Abused: No  ? Physically Abused: No  ? Sexually Abused: No  ? ?Family History  ?Problem Relation Age of Onset  ? Hypertension Mother   ? Heart attack Mother   ? Stroke Father   ? Colon polyps Neg Hx   ? Colon cancer Neg Hx   ? Esophageal cancer Neg Hx   ? Rectal cancer Neg Hx   ? Stomach cancer Neg Hx   ? ? ? ?Review of Systems  ?All other systems reviewed and are negative. ? ?   ?Objective:  ? Physical Exam ?Vitals reviewed.  ?Constitutional:   ?   General: He is not in acute distress. ?   Appearance: Normal appearance. He is well-developed and normal weight. He is not ill-appearing, toxic-appearing or diaphoretic.  ?HENT:  ?   Head: Normocephalic and atraumatic.  ?Eyes:  ?   Extraocular Movements: Extraocular movements intact.  ?   Pupils: Pupils are equal, round, and reactive to light.  ?Neck:  ?   Vascular: No carotid bruit.  ?Cardiovascular:  ?   Rate and Rhythm: Normal rate and regular rhythm.  ?   Heart sounds: Normal heart sounds. No murmur heard. ?  No friction rub. No gallop.  ?Pulmonary:  ?   Effort: Pulmonary effort is normal. No respiratory distress.  ?   Breath sounds: Normal breath sounds. No stridor. No wheezing, rhonchi or rales.  ?Chest:  ?   Chest wall: No tenderness.  ?Skin: ?   Coloration: Skin is not jaundiced.  ?   Findings: No bruising, erythema, lesion or rash.  ?Neurological:  ?   General: No focal deficit present.  ?   Mental Status: He is alert and oriented to person, place, and  time. Mental status is at baseline.  ?   Cranial Nerves: No cranial nerve deficit.  ?   Sensory: No sensory deficit.  ?   Motor: No weakness or abnormal muscle tone.  ?   Coordination: Coordination normal.  ?   Gait: Gait normal.  ?   Deep Tendon Reflexes: Reflexes are normal and symmetric.  ? ? ? ? ? ?   ?Assessment & Plan:  ?Bilateral carotid artery stenosis - Plan: US  Carotid Duplex Bilateral ? ?Episodic lightheadedness ?I am concerned that the lightheadedness may be near syncope due to cerebral hypoperfusion.  I will check a carotid Doppler to see if the blockage in his neck is worsening.  May need to proceed with CT angiogram of the neck to evaluate for vertebrobasilar insufficiency as well.  Await for the results of the carotid Doppler first.  Strongly recommended the patient take aspirin 81 mg a day which she is not currently doing ?

## 2021-07-18 ENCOUNTER — Telehealth: Payer: Self-pay

## 2021-07-18 DIAGNOSIS — R42 Dizziness and giddiness: Secondary | ICD-10-CM

## 2021-07-18 NOTE — Telephone Encounter (Signed)
Pharmacy faxed a refill request for  ? ?meclizine (ANTIVERT) 25 MG tablet [[id number]]  DISCONTINUED ?  Order Details ?Dose: 25 mg Route: Oral Frequency: 3 times daily PRN for dizziness  ?Dispense Quantity: 15 tablet Refills: 0   ?     ?Sig: Take 1 tablet (25 mg total) by mouth 3 (three) times daily as needed for dizziness.  ?     ?Start Date: 02/07/21 End Date: 02/07/21  ? ?

## 2021-07-19 ENCOUNTER — Ambulatory Visit
Admission: RE | Admit: 2021-07-19 | Discharge: 2021-07-19 | Disposition: A | Discharge status: Home / Self Care | Payer: Medicare PPO | Source: Ambulatory Visit | Attending: Family Medicine | Admitting: Family Medicine

## 2021-07-19 DIAGNOSIS — I6523 Occlusion and stenosis of bilateral carotid arteries: Secondary | ICD-10-CM | POA: Diagnosis not present

## 2021-07-19 DIAGNOSIS — E041 Nontoxic single thyroid nodule: Secondary | ICD-10-CM | POA: Diagnosis not present

## 2021-07-19 NOTE — Telephone Encounter (Signed)
Per chart patient was prescribed Meclizine in the ED 02/07/21 and was not given refills. We have not filled this rx for him.  ? ?Please advise, thanks! ?

## 2021-07-20 ENCOUNTER — Other Ambulatory Visit: Payer: Medicare PPO

## 2021-07-20 ENCOUNTER — Other Ambulatory Visit: Payer: Self-pay

## 2021-07-20 DIAGNOSIS — I6521 Occlusion and stenosis of right carotid artery: Secondary | ICD-10-CM

## 2021-07-20 MED ORDER — MECLIZINE HCL 25 MG PO TABS: 25.0000 mg | ORAL_TABLET | Freq: Three times a day (TID) | ORAL | 0 refills | Status: AC | PRN

## 2021-07-20 NOTE — Telephone Encounter (Signed)
Spoke with patient and he reports continued vertigo and lightheadedness. Patient given [place] results, which show >70% blockage of R artery. Patient advised vasc surg referral will be placed and refill of Meclizine sent to pharmacy. Nothing further needed at this time.  ? ?

## 2021-08-09 ENCOUNTER — Encounter: Payer: Self-pay | Admitting: Vascular Surgery

## 2021-08-09 ENCOUNTER — Ambulatory Visit: Payer: Medicare PPO | Admitting: Vascular Surgery

## 2021-08-09 VITALS — BP 147/80 | HR 79 | Temp 98.4°F | Resp 20 | Ht 69.0 in | Wt 218.0 lb

## 2021-08-09 DIAGNOSIS — I6523 Occlusion and stenosis of bilateral carotid arteries: Secondary | ICD-10-CM | POA: Diagnosis not present

## 2021-08-09 NOTE — Progress Notes (Signed)
? ?Patient ID: Stephen Butler, male   DOB: 05/14/1950, 71 y.o.   MRN: [id number] ? ?Reason for Consult: New Patient (Initial Visit) ?  ?Referred by Susy Frizzle, MD ? ?Subjective:  ?   ?HPI: ? ?Stephen Butler is a 71 y.o. male previously evaluated here in 2020 with a history of right carotid stenosis.  He was asymptomatic at that time but was complaining of some numbness of his face.  This has resolved.  He has now had a carotid duplex performed elsewhere to set back for reevaluation.  He continues to deny stroke, TIA or amaurosis.  He does continue to golf.  He takes aspirin intermittently does not take any anticoagulants.  He did attempt to take statins this was reduced to half dose but he still cannot tolerate.  He now follows up for evaluation of carotid artery stenosis. ? ?Past Medical History:  ?Diagnosis Date  ? Arthritis   ? Epistaxis   ? GERD (gastroesophageal reflux disease)   ? Hematochezia   ? Hypertension   ? Microscopic hematuria   ? Neuromuscular disorder ([place])   ? neuropathy  ? Stenosis of right carotid artery greater than 50%   ? ?Family History  ?Problem Relation Age of Onset  ? Hypertension Mother   ? Heart attack Mother   ? Stroke Father   ? Colon polyps Neg Hx   ? Colon cancer Neg Hx   ? Esophageal cancer Neg Hx   ? Rectal cancer Neg Hx   ? Stomach cancer Neg Hx   ? ?Past Surgical History:  ?Procedure Laterality Date  ? COLONOSCOPY    ? SHOULDER SURGERY    ? ? ?Short Social History:  ?Social History  ? ?Tobacco Use  ? Smoking status: Never  ? Smokeless tobacco: Never  ?Substance Use Topics  ? Alcohol use: No  ? ? ?No Known Allergies ? ?Current Outpatient Medications  ?Medication Sig Dispense Refill  ? amLODipine (NORVASC) 10 MG tablet Take 1 tablet by mouth once daily 90 tablet 3  ? cloNIDine (CATAPRES) 0.1 MG tablet Take 1 tablet by mouth twice daily 180 tablet 3  ? hydrochlorothiazide (HYDRODIURIL) 25 MG tablet Take 1 tablet (25 mg total) by mouth daily. 90 tablet 3  ? losartan (COZAAR)  100 MG tablet Take 1 tablet (100 mg total) by mouth daily. Stop LISINOPRIL 90 tablet 3  ? meclizine (ANTIVERT) 25 MG tablet Take 1 tablet (25 mg total) by mouth 3 (three) times daily as needed for dizziness. 30 tablet 0  ? meloxicam (MOBIC) 15 MG tablet Take by mouth.    ? pantoprazole (PROTONIX) 40 MG tablet Take 1 tablet by mouth once daily 90 tablet 3  ? predniSONE (DELTASONE) 20 MG tablet 3 tabs poqday 1-2, 2 tabs poqday 3-4, 1 tab poqday 5-6 12 tablet 0  ? rosuvastatin (CRESTOR) 10 MG tablet Take 1 tablet (10 mg total) by mouth daily. 90 tablet 3  ? sildenafil (VIAGRA) 100 MG tablet TAKE 1/2 TO 1 (ONE-HALF TO ONE) TABLET BY MOUTH ONCE DAILY AS NEEDED FOR ERECTILE DYSFUNCTION 11 tablet 0  ? tadalafil (CIALIS) 20 MG tablet Take 0.5-1 tablets (10-20 mg total) by mouth every other day as needed for erectile dysfunction. 10 tablet 11  ? ?No current facility-administered medications for this visit.  ? ? ?Review of Systems  ?Constitutional:  Constitutional negative. ?Eyes: Eyes negative.  ?Respiratory: Respiratory negative.  ?Cardiovascular: Cardiovascular negative.  ?GI: Gastrointestinal negative.  ?Musculoskeletal: Musculoskeletal negative.  ?Skin: Skin negative.  ?Neurological:  Neurological negative. ?Hematologic: Hematologic/lymphatic negative.  ?Psychiatric: Psychiatric negative.   ? ?   ?Objective:  ?Objective  ? ?Vitals:  ? 08/09/21 1444 08/09/21 1446  ?BP: 137/82 (!) 147/80  ?Pulse: 79   ?Resp: 20   ?Temp: 98.4 ?F (36.9 ?C)   ?SpO2: 93%   ?Weight: 218 lb (98.9 kg)   ?Height: '5\' 9"'$  (1.753 m)   ? ?Body mass index is 32.19 kg/m?. ? ?Physical Exam ?HENT:  ?   Head: Normocephalic.  ?   Nose: Nose normal.  ?   Mouth/Throat:  ?   Mouth: Mucous membranes are moist.  ?Eyes:  ?   Pupils: Pupils are equal, round, and reactive to light.  ?Neck:  ?   Vascular: No carotid bruit.  ?Cardiovascular:  ?   Rate and Rhythm: Normal rate.  ?   Pulses: Normal pulses.  ?Pulmonary:  ?   Effort: Pulmonary effort is normal.  ?   Breath  sounds: Normal breath sounds.  ?Abdominal:  ?   General: Abdomen is flat.  ?   Palpations: Abdomen is soft.  ?Musculoskeletal:     ?   General: Normal range of motion.  ?   Cervical back: Neck supple.  ?   Right lower leg: No edema.  ?   Left lower leg: No edema.  ?Skin: ?   General: Skin is warm.  ?   Capillary Refill: Capillary refill takes less than 2 seconds.  ?Neurological:  ?   General: No focal deficit present.  ?   Mental Status: He is alert.  ?Psychiatric:     ?   Mood and Affect: Mood normal.     ?   Behavior: Behavior normal.     ?   Thought Content: Thought content normal.     ?   Judgment: Judgment normal.  ? ? ?Data: ?CT IMPRESSION: ?Right ICA stenosis now estimated greater than 70% by ultrasound ?criteria. ?  ?Left ICA narrowing less than 50% ?  ?Patent antegrade right vertebral flow. ?  ?Nonvisualized left vertebral artery suspicious for occlusion ?  ?1.7 cm right thyroid nodule. ?    ?Assessment/Plan:  ?  ? ?71 year old male presents for evaluation of carotid artery stenosis which by our criteria would be 60 to 79% but probably right around the 70% mark.  He remains asymptomatic from this.  As before I would recommend treatment when he reaches greater than 80% and as an alternative we could obtain a CTA to identify his true level of stenosis.  I recommended baby aspirin daily and he is going to consider statin or alternative therapy given that he has not tolerated statins in the past.  I will have him follow-up in 6 months for reevaluation with repeat duplex. ? ?  ? ?Waynetta Sandy MD ?[facility] ? ? ?

## 2021-08-16 ENCOUNTER — Other Ambulatory Visit: Payer: Self-pay | Admitting: *Deleted

## 2021-08-16 DIAGNOSIS — I6523 Occlusion and stenosis of bilateral carotid arteries: Secondary | ICD-10-CM

## 2021-08-22 DIAGNOSIS — M19012 Primary osteoarthritis, left shoulder: Secondary | ICD-10-CM | POA: Diagnosis not present

## 2021-08-22 DIAGNOSIS — M19011 Primary osteoarthritis, right shoulder: Secondary | ICD-10-CM | POA: Diagnosis not present

## 2021-09-01 ENCOUNTER — Other Ambulatory Visit: Payer: Self-pay | Admitting: Family Medicine

## 2021-09-04 NOTE — Telephone Encounter (Signed)
Requested Prescriptions  Pending Prescriptions Disp Refills  . pantoprazole (PROTONIX) 40 MG tablet [Pharmacy Med Name: Pantoprazole Sodium 40 MG Oral Tablet Delayed Release] 30 tablet 0    Sig: Take 1 tablet by mouth once daily     Gastroenterology: Proton Pump Inhibitors Passed - 09/01/2021  5:20 PM      Passed - Valid encounter within last 12 months    Recent Outpatient Visits          1 month ago Bilateral carotid artery stenosis   [place] Susy Frizzle, MD   2 months ago Rib pain on left side   [place] Pickard, Cammie Mcgee, MD   7 months ago Encounter for [employer] annual wellness exam   [place] Pickard, Cammie Mcgee, MD   8 months ago [place] Schaumann, Cammie Mcgee, MD   10 months ago [place] Pickard, Cammie Mcgee, MD

## 2021-09-05 DIAGNOSIS — Z789 Other specified health status: Secondary | ICD-10-CM | POA: Insufficient documentation

## 2021-09-20 DIAGNOSIS — I10 Essential (primary) hypertension: Secondary | ICD-10-CM | POA: Diagnosis not present

## 2021-09-20 DIAGNOSIS — R109 Unspecified abdominal pain: Secondary | ICD-10-CM | POA: Diagnosis not present

## 2021-09-20 DIAGNOSIS — M62830 Muscle spasm of back: Secondary | ICD-10-CM | POA: Diagnosis not present

## 2021-10-09 DIAGNOSIS — R231 Pallor: Secondary | ICD-10-CM | POA: Diagnosis not present

## 2021-10-09 DIAGNOSIS — R55 Syncope and collapse: Secondary | ICD-10-CM | POA: Diagnosis not present

## 2021-10-09 DIAGNOSIS — E860 Dehydration: Secondary | ICD-10-CM | POA: Diagnosis not present

## 2021-10-09 DIAGNOSIS — R079 Chest pain, unspecified: Secondary | ICD-10-CM | POA: Diagnosis not present

## 2021-10-09 DIAGNOSIS — E876 Hypokalemia: Secondary | ICD-10-CM | POA: Diagnosis not present

## 2021-10-09 DIAGNOSIS — I10 Essential (primary) hypertension: Secondary | ICD-10-CM | POA: Diagnosis not present

## 2021-10-09 DIAGNOSIS — R42 Dizziness and giddiness: Secondary | ICD-10-CM | POA: Diagnosis not present

## 2021-10-26 ENCOUNTER — Ambulatory Visit (INDEPENDENT_AMBULATORY_CARE_PROVIDER_SITE_OTHER): Payer: Medicare PPO | Admitting: Family Medicine

## 2021-10-26 VITALS — BP 140/82 | HR 84 | Temp 98.5°F | Ht 69.0 in | Wt 222.0 lb

## 2021-10-26 DIAGNOSIS — R748 Abnormal levels of other serum enzymes: Secondary | ICD-10-CM

## 2021-10-26 DIAGNOSIS — I6521 Occlusion and stenosis of right carotid artery: Secondary | ICD-10-CM | POA: Diagnosis not present

## 2021-10-26 DIAGNOSIS — E7800 Pure hypercholesterolemia, unspecified: Secondary | ICD-10-CM

## 2021-10-26 MED ORDER — DOXAZOSIN MESYLATE 4 MG PO TABS: 4.0000 mg | ORAL_TABLET | Freq: Every day | ORAL | 3 refills | Status: DC

## 2021-10-26 MED ORDER — SILDENAFIL CITRATE 100 MG PO TABS: ORAL_TABLET | ORAL | 0 refills | Status: DC

## 2021-10-26 NOTE — Progress Notes (Signed)
Subjective:    Patient ID: Stephen Butler, male    DOB: 1950-12-27, 71 y.o.   MRN: [id number]  HPI  07/19/21 IMPRESSION: Right ICA stenosis now estimated greater than 70% by ultrasound criteria.   Left ICA narrowing less than 50%   Patent antegrade right vertebral flow.   Nonvisualized left vertebral artery suspicious for occlusion   1.7 cm right thyroid nodule.   Recommend thyroid US   Saw vascular surgery who recommended: 71 year old male presents for evaluation of carotid artery stenosis which by our criteria would be 60 to 79% but probably right around the 70% mark.  He remains asymptomatic from this.  As before I would recommend treatment when he reaches greater than 80% and as an alternative we could obtain a CTA to identify his true level of stenosis.  I recommended baby aspirin daily and he is going to consider statin or alternative therapy given that he has not tolerated statins in the past.  I will have him follow-up in 6 months for reevaluation with repeat duplex.  10/26/21 Here for follow up.  Recently, the patient had to go to the emergency room for near syncope.  He states that he was in the yard working but for less than an hour.  He stood up and felt like he was going to pass out.  In the emergency room he was told that his potassium was low at 3 and they started him on a potassium supplement.  He states however that his blood pressures at home are typically 350-093 systolic.  They are better here but this is certainly too high.  He admits that he is eating a lot of salt and he also admits that he is not taking his second dose of clonidine.  His wife is a Marine scientist and that she is concerned that his "brain is not getting enough blood".  She feels that they need to repair the artery in his neck.  I have quoted above vascular surgery's recommendation that they would like to see a second vascular surgeon for a second opinion. Past Medical History:  Diagnosis Date   Arthritis     Epistaxis    GERD (gastroesophageal reflux disease)    Hematochezia    Hypertension    Microscopic hematuria    Neuromuscular disorder ([facility])    neuropathy   Stenosis of right carotid artery greater than 50%    Past Surgical History:  Procedure Laterality Date   COLONOSCOPY     SHOULDER SURGERY     Current Outpatient Medications on File Prior to Visit  Medication Sig Dispense Refill   amLODipine (NORVASC) 10 MG tablet Take 1 tablet by mouth once daily 90 tablet 3   cloNIDine (CATAPRES) 0.1 MG tablet Take 1 tablet by mouth twice daily 180 tablet 3   hydrochlorothiazide (HYDRODIURIL) 25 MG tablet Take 1 tablet (25 mg total) by mouth daily. 90 tablet 3   losartan (COZAAR) 100 MG tablet Take 1 tablet (100 mg total) by mouth daily. Stop LISINOPRIL 90 tablet 3   meclizine (ANTIVERT) 25 MG tablet Take 1 tablet (25 mg total) by mouth 3 (three) times daily as needed for dizziness. 30 tablet 0   meloxicam (MOBIC) 15 MG tablet Take by mouth.     pantoprazole (PROTONIX) 40 MG tablet Take 1 tablet by mouth once daily 90 tablet 0   predniSONE (DELTASONE) 20 MG tablet 3 tabs poqday 1-2, 2 tabs poqday 3-4, 1 tab poqday 5-6 12 tablet 0   rosuvastatin (  CRESTOR) 10 MG tablet Take 1 tablet (10 mg total) by mouth daily. 90 tablet 3   sildenafil (VIAGRA) 100 MG tablet TAKE 1/2 TO 1 (ONE-HALF TO ONE) TABLET BY MOUTH ONCE DAILY AS NEEDED FOR ERECTILE DYSFUNCTION 11 tablet 0   tadalafil (CIALIS) 20 MG tablet Take 0.5-1 tablets (10-20 mg total) by mouth every other day as needed for erectile dysfunction. 10 tablet 11   No current facility-administered medications on file prior to visit.    No Known Allergies Social History   Socioeconomic History   Marital status: Married    Spouse name: Not on file   Number of children: 0   Years of education: Not on file   Highest education level: Not on file  Occupational History   Occupation: retired  Tobacco Use   Smoking status: Never   Smokeless tobacco:  Never  Vaping Use   Vaping Use: Never used  Substance and Sexual Activity   Alcohol use: No   Drug use: No   Sexual activity: Yes    Partners: Female  Other Topics Concern   Not on file  Social History Narrative   Not on file   Social Determinants of Health   Financial Resource Strain: Low Risk  (01/08/2021)   Overall Financial Resource Strain (CARDIA)    Difficulty of Paying Living Expenses: Not hard at all  Food Insecurity: No Food Insecurity (01/08/2021)   Hunger Vital Sign    Worried About Running Out of Food in the Last Year: Never true    [place] in the Last Year: Never true  Transportation Needs: No Transportation Needs (01/08/2021)   PRAPARE - Hydrologist (Medical): No    Lack of Transportation (Non-Medical): No  Physical Activity: Sufficiently Active (01/08/2021)   Exercise Vital Sign    Days of Exercise per Week: 5 days    Minutes of Exercise per Session: 60 min  Stress: Stress Concern Present (01/08/2021)   [place]    Feeling of Stress : To some extent  Social Connections: Socially Integrated (01/08/2021)   Social Connection and Isolation Panel [NHANES]    Frequency of Communication with Friends and Family: Three times a week    Frequency of Social Gatherings with Friends and Family: Once a week    Attends Religious Services: More than 4 times per year    Active Member of [employer] or Organizations: Yes    Attends Music therapist: More than 4 times per year    Marital Status: Married  Human resources officer Violence: Not At Risk (01/08/2021)   Humiliation, Afraid, Rape, and Kick questionnaire    Fear of Current or Ex-Partner: No    Emotionally Abused: No    Physically Abused: No    Sexually Abused: No   Family History  Problem Relation Age of Onset   Hypertension Mother    Heart attack Mother    Stroke Father    Colon polyps Neg Hx    Colon cancer Neg  Hx    Esophageal cancer Neg Hx    Rectal cancer Neg Hx    Stomach cancer Neg Hx      Review of Systems  All other systems reviewed and are negative.      Objective:   Physical Exam Vitals reviewed.  Constitutional:      General: He is not in acute distress.    Appearance: Normal appearance. He is well-developed and  normal weight. He is not ill-appearing, toxic-appearing or diaphoretic.  HENT:     Head: Normocephalic and atraumatic.  Eyes:     Extraocular Movements: Extraocular movements intact.     Pupils: Pupils are equal, round, and reactive to light.  Neck:     Vascular: No carotid bruit.  Cardiovascular:     Rate and Rhythm: Normal rate and regular rhythm.     Heart sounds: Normal heart sounds. No murmur heard.    No friction rub. No gallop.  Pulmonary:     Effort: Pulmonary effort is normal. No respiratory distress.     Breath sounds: Normal breath sounds. No stridor. No wheezing, rhonchi or rales.  Chest:     Chest wall: No tenderness.  Skin:    Coloration: Skin is not jaundiced.     Findings: No bruising, erythema, lesion or rash.  Neurological:     General: No focal deficit present.     Mental Status: He is alert and oriented to person, place, and time. Mental status is at baseline.     Cranial Nerves: No cranial nerve deficit.     Sensory: No sensory deficit.     Motor: No weakness or abnormal muscle tone.     Coordination: Coordination normal.     Gait: Gait normal.     Deep Tendon Reflexes: Reflexes are normal and symmetric.           Assessment & Plan:  Carotid stenosis, right - Plan: Lipid panel, COMPLETE METABOLIC PANEL WITH GFR, BASIC METABOLIC PANEL WITH GFR  Pure hypercholesterolemia - Plan: Lipid panel, COMPLETE METABOLIC PANEL WITH GFR  Elevated CK Patient is having near syncopal episodes which sound like orthostatic hypotension however this is complicated by the fact that his blood pressure is extremely high with systolic blood pressures  160-170.  I think part of the problem is he is not consistently taking his clonidine.  Therefore we will discontinue clonidine and replace with better 24-hour coverage using doxazosin 4 mg a day.  I will recheck his blood pressure in 2 to 3 weeks.  Repeat a CMP to monitor his potassium also check a fasting lipid panel because I want his LDL cholesterol below 70.  I will refer the patient to vascular surgery for second opinion hopefully to help with his wife's mind and the patient's mind at ease.  I will certainly defer to their recommendations.

## 2021-10-27 LAB — LIPID PANEL
Cholesterol: 205 mg/dL — ABNORMAL HIGH (ref <200)
HDL: 55 mg/dL (ref >=40)
LDL Cholesterol (Calc): 125 mg/dL (calc) — ABNORMAL HIGH
Non-HDL Cholesterol (Calc): 150 mg/dL (calc) — ABNORMAL HIGH (ref <130)
Total CHOL/HDL Ratio: 3.7 (calc) (ref <5.0)
Triglycerides: 132 mg/dL (ref <150)

## 2021-10-27 LAB — COMPLETE METABOLIC PANEL WITH GFR
AG Ratio: 1.5 (calc) (ref 1.0–2.5)
ALT: 30 U/L (ref 9–46)
AST: 25 U/L (ref 10–35)
Albumin: 4.3 g/dL (ref 3.6–5.1)
Alkaline phosphatase (APISO): 100 U/L (ref 35–144)
BUN: 10 mg/dL (ref 7–25)
CO2: 28 mmol/L (ref 20–32)
Calcium: 9.7 mg/dL (ref 8.6–10.3)
Chloride: 107 mmol/L (ref 98–110)
Creat: 1.2 mg/dL (ref 0.70–1.35)
Globulin: 2.8 g/dL (calc) (ref 1.9–3.7)
Glucose, Bld: 96 mg/dL (ref 65–99)
Potassium: 4.3 mmol/L (ref 3.5–5.3)
Sodium: 143 mmol/L (ref 135–146)
Total Bilirubin: 0.5 mg/dL (ref 0.2–1.2)
Total Protein: 7.1 g/dL (ref 6.1–8.1)
eGFR: 65 mL/min/{1.73_m2} (ref >=60)

## 2021-10-27 LAB — EXTRA LAV TOP TUBE

## 2021-11-14 DIAGNOSIS — I10 Essential (primary) hypertension: Secondary | ICD-10-CM | POA: Insufficient documentation

## 2021-11-14 DIAGNOSIS — E079 Disorder of thyroid, unspecified: Secondary | ICD-10-CM | POA: Diagnosis not present

## 2021-11-14 DIAGNOSIS — I6523 Occlusion and stenosis of bilateral carotid arteries: Secondary | ICD-10-CM | POA: Insufficient documentation

## 2021-11-21 DIAGNOSIS — M19012 Primary osteoarthritis, left shoulder: Secondary | ICD-10-CM | POA: Diagnosis not present

## 2021-11-21 DIAGNOSIS — M19011 Primary osteoarthritis, right shoulder: Secondary | ICD-10-CM | POA: Diagnosis not present

## 2021-11-22 DIAGNOSIS — I10 Essential (primary) hypertension: Secondary | ICD-10-CM | POA: Diagnosis not present

## 2021-11-22 DIAGNOSIS — M62830 Muscle spasm of back: Secondary | ICD-10-CM | POA: Diagnosis not present

## 2021-11-22 DIAGNOSIS — R109 Unspecified abdominal pain: Secondary | ICD-10-CM | POA: Diagnosis not present

## 2021-11-28 DIAGNOSIS — E041 Nontoxic single thyroid nodule: Secondary | ICD-10-CM | POA: Diagnosis not present

## 2021-11-28 DIAGNOSIS — I6523 Occlusion and stenosis of bilateral carotid arteries: Secondary | ICD-10-CM | POA: Diagnosis not present

## 2021-11-28 DIAGNOSIS — M4802 Spinal stenosis, cervical region: Secondary | ICD-10-CM | POA: Diagnosis not present

## 2021-11-28 DIAGNOSIS — E785 Hyperlipidemia, unspecified: Secondary | ICD-10-CM | POA: Diagnosis not present

## 2021-11-28 DIAGNOSIS — M50322 Other cervical disc degeneration at C5-C6 level: Secondary | ICD-10-CM | POA: Diagnosis not present

## 2021-11-28 DIAGNOSIS — I7121 Aneurysm of the ascending aorta, without rupture: Secondary | ICD-10-CM | POA: Diagnosis not present

## 2021-11-28 DIAGNOSIS — I70209 Unspecified atherosclerosis of native arteries of extremities, unspecified extremity: Secondary | ICD-10-CM | POA: Diagnosis not present

## 2021-11-28 DIAGNOSIS — Z79899 Other long term (current) drug therapy: Secondary | ICD-10-CM | POA: Diagnosis not present

## 2021-12-01 ENCOUNTER — Encounter: Payer: Self-pay | Admitting: Family Medicine

## 2021-12-01 DIAGNOSIS — E041 Nontoxic single thyroid nodule: Secondary | ICD-10-CM | POA: Insufficient documentation

## 2021-12-06 ENCOUNTER — Other Ambulatory Visit: Payer: Self-pay | Admitting: Family Medicine

## 2021-12-06 DIAGNOSIS — R079 Chest pain, unspecified: Secondary | ICD-10-CM

## 2021-12-06 NOTE — Telephone Encounter (Signed)
Requested medication (s) are due for refill today: expired medication  Requested medication (s) are on the active medication list: yes  Last refill:  07/18/20 #90 3 refills  Future visit scheduled: no  Notes to clinic:  expired medication. Do you want to renew Rx?     Requested Prescriptions  Pending Prescriptions Disp Refills   amLODipine (NORVASC) 10 MG tablet [Pharmacy Med Name: amLODIPine Besylate 10 MG Oral Tablet] 90 tablet 0    Sig: Take 1 tablet by mouth once daily     Cardiovascular: Calcium Channel Blockers 2 Failed - 12/06/2021  3:49 PM      Failed - Last BP in normal range    BP Readings from Last 1 Encounters:  10/26/21 140/82         Passed - Last Heart Rate in normal range    Pulse Readings from Last 1 Encounters:  10/26/21 84         Passed - Valid encounter within last 6 months    Recent Outpatient Visits           4 months ago Bilateral carotid artery stenosis   [place] Susy Frizzle, MD   5 months ago Rib pain on left side   [place] Pickard, Cammie Mcgee, MD   11 months ago Encounter for [employer] annual wellness exam   [place] Pickard, Cammie Mcgee, MD   11 months ago [place], Cammie Mcgee, MD   1 year ago [place] Pickard, Cammie Mcgee, MD

## 2021-12-15 ENCOUNTER — Ambulatory Visit: Payer: Medicare PPO | Admitting: Podiatry

## 2021-12-15 DIAGNOSIS — B351 Tinea unguium: Secondary | ICD-10-CM | POA: Diagnosis not present

## 2021-12-15 DIAGNOSIS — B353 Tinea pedis: Secondary | ICD-10-CM | POA: Diagnosis not present

## 2021-12-15 DIAGNOSIS — L84 Corns and callosities: Secondary | ICD-10-CM | POA: Diagnosis not present

## 2021-12-16 ENCOUNTER — Encounter: Payer: Self-pay | Admitting: Podiatry

## 2021-12-16 NOTE — Progress Notes (Signed)
  Subjective:  Patient ID: Stephen Butler, male    DOB: Apr 23, 1950,  MRN: [id number]  Chief Complaint  Patient presents with   Nail Problem    Thick discolored toenails   Callouses    Bilateral great toes   Tinea Pedis    Dry skin/areas that are peeling on both feet    71 y.o. male presents with the above complaint. History confirmed with patient. Patient states he is here for concern for both dry peeling skin on both feet as well as nail thickening. Denies history of diabetes. Also issue with painful calluses on bottom of both feet. Denies itching of either foot. Has tried using vaseline for moisture on both feet but not helping.   Objective:  Physical Exam: warm, good capillary refill, nail exam onychomycosis of the toenails, dystrophic nails, and calluses present sub 5th met head bilaterally. Xerosis present bilaterally with dry peeling skin in moccasin distribution bilateral foot.DP pulses palpable, PT pulses palpable, and protective sensation intact Left Foot: normal exam, no swelling, tenderness, instability; ligaments intact, full range of motion of all ankle/foot joints  Right Foot: normal exam, no swelling, tenderness, instability; ligaments intact, full range of motion of all ankle/foot joints   No images are attached to the encounter.  Assessment:   1. Tinea pedis of both feet   2. Callus of foot   3. Onychomycosis of toenail      Plan:  Patient was evaluated and treated and all questions answered.  Tinea Pedis/ Athletes foot bilateral, Onychomycosis, Onychodystrophy, and bilateral  I discussed the the patient has both fungal nail infection as well as tinea pedis bilaterally. Believe fungal infection of the skin is causing his dry peeling skin bilaterally. I instructed him to use OTC tinactin or lotrimin antifungal spray or cream on both feet daily. I debrided the nails 1-5 bilaterally for pain control as patient is unable to trim his own nails due to thickness. He will  follow up in 3 mo for ongoing treatment of tinea pedis and tinea unguium.    Return in about 3 months (around 03/16/2022) for RFC.

## 2022-01-02 DIAGNOSIS — E042 Nontoxic multinodular goiter: Secondary | ICD-10-CM | POA: Diagnosis not present

## 2022-01-02 DIAGNOSIS — I7789 Other specified disorders of arteries and arterioles: Secondary | ICD-10-CM | POA: Diagnosis not present

## 2022-01-02 DIAGNOSIS — I7121 Aneurysm of the ascending aorta, without rupture: Secondary | ICD-10-CM | POA: Diagnosis not present

## 2022-01-02 DIAGNOSIS — I2510 Atherosclerotic heart disease of native coronary artery without angina pectoris: Secondary | ICD-10-CM | POA: Diagnosis not present

## 2022-01-02 DIAGNOSIS — E041 Nontoxic single thyroid nodule: Secondary | ICD-10-CM | POA: Diagnosis not present

## 2022-01-02 DIAGNOSIS — I6523 Occlusion and stenosis of bilateral carotid arteries: Secondary | ICD-10-CM | POA: Diagnosis not present

## 2022-01-02 DIAGNOSIS — E785 Hyperlipidemia, unspecified: Secondary | ICD-10-CM | POA: Diagnosis not present

## 2022-01-02 DIAGNOSIS — K760 Fatty (change of) liver, not elsewhere classified: Secondary | ICD-10-CM | POA: Diagnosis not present

## 2022-01-02 DIAGNOSIS — I10 Essential (primary) hypertension: Secondary | ICD-10-CM | POA: Diagnosis not present

## 2022-01-02 DIAGNOSIS — I77811 Abdominal aortic ectasia: Secondary | ICD-10-CM | POA: Diagnosis not present

## 2022-01-11 ENCOUNTER — Ambulatory Visit: Payer: Medicare PPO | Admitting: Podiatry

## 2022-01-18 ENCOUNTER — Other Ambulatory Visit: Payer: Self-pay | Admitting: Family Medicine

## 2022-01-18 NOTE — Telephone Encounter (Signed)
Requested Prescriptions  Pending Prescriptions Disp Refills  . pantoprazole (PROTONIX) 40 MG tablet [Pharmacy Med Name: Pantoprazole Sodium 40 MG Oral Tablet Delayed Release] 90 tablet 0    Sig: Take 1 tablet by mouth once daily     Gastroenterology: Proton Pump Inhibitors Passed - 01/18/2022 11:27 AM      Passed - Valid encounter within last 12 months    Recent Outpatient Visits          6 months ago Bilateral carotid artery stenosis   [place] Susy Frizzle, MD   7 months ago Rib pain on left side   [place] Pickard, Cammie Mcgee, MD   1 year ago Encounter for Medicare annual wellness exam   [place] Susy Frizzle, MD   1 year ago [place], Cammie Mcgee, MD   1 year ago [place] Pickard, Cammie Mcgee, MD

## 2022-03-06 ENCOUNTER — Telehealth: Payer: Self-pay

## 2022-03-06 ENCOUNTER — Other Ambulatory Visit: Payer: Self-pay

## 2022-03-06 DIAGNOSIS — R079 Chest pain, unspecified: Secondary | ICD-10-CM

## 2022-03-06 MED ORDER — AMLODIPINE BESYLATE 10 MG PO TABS: 10.0000 mg | ORAL_TABLET | Freq: Every day | ORAL | 0 refills | Status: DC

## 2022-03-06 NOTE — Telephone Encounter (Signed)
  Prescription Request  03/06/2022  Is this a "Controlled Substance" medicine? No  LOV: 12/06/2021   What is the name of the medication or equipment? amLODipine (NORVASC) 10 MG tablet [[id number]]   Have you contacted your pharmacy to request a refill? Yes   Which pharmacy would you like this sent to?  [place], [place] [address] [place] [address] Phone: [phone] Fax: [phone]   Patient notified that their request is being sent to the clinical staff for review and that they should receive a response within 2 business days.   Please advise at [phone]

## 2022-03-20 ENCOUNTER — Ambulatory Visit: Payer: Medicare PPO | Admitting: Podiatry

## 2022-03-21 ENCOUNTER — Other Ambulatory Visit: Payer: Self-pay | Admitting: Family Medicine

## 2022-03-28 DIAGNOSIS — T502X5A Adverse effect of carbonic-anhydrase inhibitors, benzothiadiazides and other diuretics, initial encounter: Secondary | ICD-10-CM | POA: Diagnosis not present

## 2022-03-28 DIAGNOSIS — R55 Syncope and collapse: Secondary | ICD-10-CM | POA: Diagnosis not present

## 2022-03-28 DIAGNOSIS — I10 Essential (primary) hypertension: Secondary | ICD-10-CM | POA: Diagnosis not present

## 2022-03-28 DIAGNOSIS — I771 Stricture of artery: Secondary | ICD-10-CM | POA: Diagnosis not present

## 2022-03-28 DIAGNOSIS — I651 Occlusion and stenosis of basilar artery: Secondary | ICD-10-CM | POA: Diagnosis not present

## 2022-03-28 DIAGNOSIS — R7989 Other specified abnormal findings of blood chemistry: Secondary | ICD-10-CM | POA: Diagnosis not present

## 2022-03-28 DIAGNOSIS — I959 Hypotension, unspecified: Secondary | ICD-10-CM | POA: Diagnosis not present

## 2022-03-28 DIAGNOSIS — E876 Hypokalemia: Secondary | ICD-10-CM | POA: Diagnosis not present

## 2022-03-28 DIAGNOSIS — R9431 Abnormal electrocardiogram [ECG] [EKG]: Secondary | ICD-10-CM | POA: Diagnosis not present

## 2022-03-28 DIAGNOSIS — E861 Hypovolemia: Secondary | ICD-10-CM | POA: Diagnosis not present

## 2022-03-28 DIAGNOSIS — I6502 Occlusion and stenosis of left vertebral artery: Secondary | ICD-10-CM | POA: Diagnosis not present

## 2022-03-28 DIAGNOSIS — I6521 Occlusion and stenosis of right carotid artery: Secondary | ICD-10-CM | POA: Diagnosis not present

## 2022-03-28 DIAGNOSIS — R569 Unspecified convulsions: Secondary | ICD-10-CM | POA: Diagnosis not present

## 2022-03-29 ENCOUNTER — Telehealth: Payer: Self-pay

## 2022-03-29 DIAGNOSIS — I959 Hypotension, unspecified: Secondary | ICD-10-CM | POA: Diagnosis not present

## 2022-03-29 DIAGNOSIS — E861 Hypovolemia: Secondary | ICD-10-CM | POA: Diagnosis not present

## 2022-03-29 DIAGNOSIS — T502X5A Adverse effect of carbonic-anhydrase inhibitors, benzothiadiazides and other diuretics, initial encounter: Secondary | ICD-10-CM | POA: Diagnosis not present

## 2022-03-29 DIAGNOSIS — I651 Occlusion and stenosis of basilar artery: Secondary | ICD-10-CM | POA: Diagnosis not present

## 2022-03-29 DIAGNOSIS — R55 Syncope and collapse: Secondary | ICD-10-CM | POA: Diagnosis not present

## 2022-03-29 DIAGNOSIS — G4733 Obstructive sleep apnea (adult) (pediatric): Secondary | ICD-10-CM | POA: Diagnosis not present

## 2022-03-29 DIAGNOSIS — E876 Hypokalemia: Secondary | ICD-10-CM | POA: Diagnosis not present

## 2022-03-29 DIAGNOSIS — I771 Stricture of artery: Secondary | ICD-10-CM | POA: Diagnosis not present

## 2022-03-29 DIAGNOSIS — R9431 Abnormal electrocardiogram [ECG] [EKG]: Secondary | ICD-10-CM | POA: Diagnosis not present

## 2022-03-29 DIAGNOSIS — I10 Essential (primary) hypertension: Secondary | ICD-10-CM | POA: Diagnosis not present

## 2022-03-29 DIAGNOSIS — R7989 Other specified abnormal findings of blood chemistry: Secondary | ICD-10-CM | POA: Diagnosis not present

## 2022-03-29 NOTE — Telephone Encounter (Signed)
Pt's wife called, stating that pt is in the [facility] in [place], [place] since yesterday.   Pt went to hosp. Due having episodes of passing out (3X's) yesterday. Per the PA told wife that pt may be having seizures plus pt's K+ is low.   Per pt's wife pt stated that he would like to know if you remembered the neurologist that you referred him a couple months back?   Per pt is currently still in hospital, would preferred being transferred to [facility] and [facility], so they would appreciate that name of the neurologist.   Pls advice or if you can call wifeKarie Kirks at  [phone]

## 2022-03-29 NOTE — Telephone Encounter (Signed)
Called and spoke w/pt and he is now over at [facility]. No longer need a neurologist at this time. Per pt will f/u with pcp next week.

## 2022-03-30 DIAGNOSIS — I771 Stricture of artery: Secondary | ICD-10-CM | POA: Diagnosis not present

## 2022-03-30 DIAGNOSIS — R7989 Other specified abnormal findings of blood chemistry: Secondary | ICD-10-CM | POA: Diagnosis not present

## 2022-03-30 DIAGNOSIS — I959 Hypotension, unspecified: Secondary | ICD-10-CM | POA: Diagnosis not present

## 2022-03-30 DIAGNOSIS — E876 Hypokalemia: Secondary | ICD-10-CM | POA: Diagnosis not present

## 2022-03-30 DIAGNOSIS — G4733 Obstructive sleep apnea (adult) (pediatric): Secondary | ICD-10-CM | POA: Diagnosis not present

## 2022-03-30 DIAGNOSIS — I651 Occlusion and stenosis of basilar artery: Secondary | ICD-10-CM | POA: Diagnosis not present

## 2022-03-30 DIAGNOSIS — T502X5A Adverse effect of carbonic-anhydrase inhibitors, benzothiadiazides and other diuretics, initial encounter: Secondary | ICD-10-CM | POA: Diagnosis not present

## 2022-03-30 DIAGNOSIS — E861 Hypovolemia: Secondary | ICD-10-CM | POA: Diagnosis not present

## 2022-03-30 DIAGNOSIS — R55 Syncope and collapse: Secondary | ICD-10-CM | POA: Diagnosis not present

## 2022-03-30 DIAGNOSIS — R9431 Abnormal electrocardiogram [ECG] [EKG]: Secondary | ICD-10-CM | POA: Diagnosis not present

## 2022-03-30 DIAGNOSIS — I10 Essential (primary) hypertension: Secondary | ICD-10-CM | POA: Diagnosis not present

## 2022-04-05 ENCOUNTER — Ambulatory Visit (INDEPENDENT_AMBULATORY_CARE_PROVIDER_SITE_OTHER): Payer: Medicare PPO

## 2022-04-05 VITALS — Ht 68.0 in | Wt 218.0 lb

## 2022-04-05 DIAGNOSIS — Z0000 Encounter for general adult medical examination without abnormal findings: Secondary | ICD-10-CM

## 2022-04-05 NOTE — Progress Notes (Signed)
Subjective:   Stephen Butler is a 71 y.o. male who presents for Medicare Annual/Subsequent preventive examination.  I connected with  Renato Shin on 04/05/22 by a audio enabled telemedicine application and verified that I am speaking with the correct person using two identifiers.  Patient Location: Home  Provider Location: Home Office  I discussed the limitations of evaluation and management by telemedicine. The patient expressed understanding and agreed to proceed.  Review of Systems     Cardiac Risk Factors include: advanced age (>39mn, >>9women);male gender;hypertension;dyslipidemia     Objective:    Today's Vitals   04/05/22 1201  Weight: 218 lb (98.9 kg)  Height: '5\' 8"'$  (1.727 m)   Body mass index is 33.15 kg/m.     04/05/2022   12:08 PM 02/07/2021    2:40 AM 07/16/2016   10:31 PM  Advanced Directives  Does Patient Have a Medical Advance Directive? No No No  Does patient want to make changes to medical advance directive? Yes (MAU/Ambulatory/Procedural Areas - Information given)    Would patient like information on creating a medical advance directive?  No - Patient declined No - Patient declined    Current Medications (verified) Outpatient Encounter Medications as of 04/05/2022  Medication Sig   amLODipine (NORVASC) 10 MG tablet Take 1 tablet (10 mg total) by mouth daily.   aspirin EC 81 MG tablet Take by mouth.   atorvastatin (LIPITOR) 40 MG tablet Take by mouth.   cloNIDine (CATAPRES) 0.1 MG tablet Take 1 tablet by mouth twice daily   cyclobenzaprine (FLEXERIL) 10 MG tablet Take 10 mg by mouth 3 (three) times daily.   doxazosin (CARDURA) 4 MG tablet TAKE 1 TABLET BY MOUTH ONCE DAILY *STOP CLONIDINE*   losartan (COZAAR) 100 MG tablet Take 1 tablet (100 mg total) by mouth daily. Stop LISINOPRIL   meclizine (ANTIVERT) 25 MG tablet Take 1 tablet (25 mg total) by mouth 3 (three) times daily as needed for dizziness.   pantoprazole (PROTONIX) 40 MG tablet Take 1  tablet by mouth once daily   rosuvastatin (CRESTOR) 10 MG tablet Take 1 tablet (10 mg total) by mouth daily.   tadalafil (CIALIS) 20 MG tablet Take 0.5-1 tablets (10-20 mg total) by mouth every other day as needed for erectile dysfunction.   hydrochlorothiazide (HYDRODIURIL) 25 MG tablet Take 1 tablet (25 mg total) by mouth daily. (Patient not taking: Reported on 04/05/2022)   traMADol (ULTRAM) 50 MG tablet Take by mouth. (Patient not taking: Reported on 04/05/2022)   [DISCONTINUED] methylPREDNISolone (MEDROL DOSEPAK) 4 MG TBPK tablet Take by mouth. (Patient not taking: Reported on 04/05/2022)   No facility-administered encounter medications on file as of 04/05/2022.    Allergies (verified) Patient has no known allergies.   History: Past Medical History:  Diagnosis Date   Arthritis    Epistaxis    GERD (gastroesophageal reflux disease)    Hematochezia    Hypertension    Microscopic hematuria    Neuromuscular disorder (HCC)    neuropathy   Stenosis of right carotid artery greater than 50%    Thyroid nodule    repeat u[place] 1 year (8/24)   Past Surgical History:  Procedure Laterality Date   COLONOSCOPY     SHOULDER SURGERY     Family History  Problem Relation Age of Onset   Hypertension Mother    Heart attack Mother    Stroke Father    Colon polyps Neg Hx    Colon cancer Neg Hx  Esophageal cancer Neg Hx    Rectal cancer Neg Hx    Stomach cancer Neg Hx    Social History   Socioeconomic History   Marital status: Married    Spouse name: Not on file   Number of children: 0   Years of education: Not on file   Highest education level: Not on file  Occupational History   Occupation: retired  Tobacco Use   Smoking status: Never   Smokeless tobacco: Never  Vaping Use   Vaping Use: Never used  Substance and Sexual Activity   Alcohol use: No   Drug use: No   Sexual activity: Yes    Partners: Female  Other Topics Concern   Not on file  Social History Narrative    Not on file   Social Determinants of Health   Financial Resource Strain: Low Risk  (04/05/2022)   Overall Financial Resource Strain (CARDIA)    Difficulty of Paying Living Expenses: Not hard at all  Food Insecurity: No Food Insecurity (04/05/2022)   Hunger Vital Sign    Worried About Running Out of Food in the Last Year: Never true    [place] in the Last Year: Never true  Transportation Needs: No Transportation Needs (04/05/2022)   PRAPARE - Hydrologist (Medical): No    Lack of Transportation (Non-Medical): No  Physical Activity: Insufficiently Active (04/05/2022)   Exercise Vital Sign    Days of Exercise per Week: 3 days    Minutes of Exercise per Session: 30 min  Stress: No Stress Concern Present (04/05/2022)   [place]    Feeling of Stress : Only a little  Social Connections: Socially Integrated (04/05/2022)   Social Connection and Isolation Panel [NHANES]    Frequency of Communication with Friends and Family: More than three times a week    Frequency of Social Gatherings with Friends and Family: Three times a week    Attends Religious Services: More than 4 times per year    Active Member of Clubs or Organizations: Yes    Attends Music therapist: More than 4 times per year    Marital Status: Married    Tobacco Counseling Counseling given: Not Answered   Clinical Intake:  Pre-visit preparation completed: Yes  Pain : No/denies pain  Diabetes: No  How often do you need to have someone help you when you read instructions, pamphlets, or other written materials from your doctor or pharmacy?: 1 - Never  Diabetic?No  Interpreter Needed?: No  Information entered by :: Denman George LPN   Activities of Daily Living    04/05/2022   12:03 PM  In your present state of health, do you have any difficulty performing the following activities:  Hearing? 0   Vision? 0  Difficulty concentrating or making decisions? 0  Walking or climbing stairs? 0  Dressing or bathing? 0  Doing errands, shopping? 0  Preparing Food and eating ? N  Using the Toilet? N  In the past six months, have you accidently leaked urine? N  Do you have problems with loss of bowel control? N  Managing your Medications? N  Managing your Finances? N  Housekeeping or managing your Housekeeping? N    Patient Care Team: Susy Frizzle, MD as PCP - General (Family Medicine)  Indicate any recent Medical Services you may have received from other than [facility] providers in the past year (date may  be approximate).     Assessment:   This is a routine wellness examination for [place].  Hearing/Vision screen Hearing Screening - Comments:: Denies hearing difficulties  Vision Screening - Comments:: Wears rx glasses - up to date with routine eye exams with [facility]   Dietary issues and exercise activities discussed:     Goals Addressed   None   Depression Screen    04/05/2022    1:15 PM 01/08/2021    9:43 AM 01/08/2021    9:37 AM 07/31/2018    2:31 PM 07/02/2017    3:07 PM  PHQ 2/9 Scores  PHQ - 2 Score 0 0 0 0 0    Fall Risk    04/05/2022   12:01 PM 01/08/2021    9:42 AM 06/13/2020   10:35 AM 07/31/2018    2:31 PM 07/02/2017    3:07 PM  Fall Risk   Falls in the past year? 0 0 0 0 No  Number falls in past yr: 0 0 0    Injury with Fall? 0 0 0    Risk for fall due to : No Fall Risks No Fall Risks No Fall Risks    Follow up Falls evaluation completed;Education provided;Falls prevention discussed Follow up appointment Falls evaluation completed Falls evaluation completed     [place]:  Any stairs in or around the home? Yes  If so, are there any without handrails? No  Home free of loose throw rugs in walkways, pet beds, electrical cords, etc? Yes  Adequate lighting in your home to reduce risk of falls? Yes   ASSISTIVE  DEVICES UTILIZED TO PREVENT FALLS:  Life alert? No  Use of a cane, walker or w/c? No  Grab bars in the bathroom? No  Shower chair or bench in shower? No  Elevated toilet seat or a handicapped toilet? No   TIMED UP AND GO:  Was the test performed? No . Telephonic visit   Cognitive Function:        04/05/2022   12:03 PM 01/08/2021    9:43 AM  6CIT Screen  What Year? 0 points 0 points  What month? 0 points 0 points  What time? 0 points 0 points  Count back from 20 0 points 2 points  Months in reverse 0 points 2 points  Repeat phrase 0 points 6 points  Total Score 0 points 10 points    Immunizations Immunization History  Administered Date(s) Administered   Fluad Quad(high Dose 65+) 01/06/2021   PFIZER(Purple Top)SARS-COV-2 Vaccination 06/25/2019, 07/20/2019   Pneumococcal Conjugate-13 06/13/2020   Pneumococcal Polysaccharide-23 07/02/2017    TDAP status: Due, Education has been provided regarding the importance of this vaccine. Advised may receive this vaccine at local pharmacy or Health Dept. Aware to provide a copy of the vaccination record if obtained from local pharmacy or Health Dept. Verbalized acceptance and understanding.  Flu Vaccine status: Up to date  Pneumococcal vaccine status: Up to date  Covid-19 vaccine status: Information provided on how to obtain vaccines.   Qualifies for Shingles Vaccine? Yes   Zostavax completed No   Shingrix Completed?: No.    Education has been provided regarding the importance of this vaccine. Patient has been advised to call insurance company to determine out of pocket expense if they have not yet received this vaccine. Advised may also receive vaccine at local pharmacy or Health Dept. Verbalized acceptance and understanding.  Screening Tests Health Maintenance  Topic Date Due  DTaP/Tdap/Td (1 - Tdap) Never done   Zoster Vaccines- Shingrix (1 of 2) Never done   COVID-19 Vaccine (3 - 2023-24 season) 12/15/2021   Medicare  Annual Wellness (AWV)  04/06/2023   COLONOSCOPY (Pts 45-25yr Insurance coverage will need to be confirmed)  10/05/2025   Pneumonia Vaccine 71 Years old  Completed   INFLUENZA VACCINE  Completed   Hepatitis C Screening  Completed   HPV VACCINES  Aged Out    Health Maintenance  Health Maintenance Due  Topic Date Due   DTaP/Tdap/Td (1 - Tdap) Never done   Zoster Vaccines- Shingrix (1 of 2) Never done   COVID-19 Vaccine (3 - 2023-24 season) 12/15/2021    Colorectal cancer screening: Type of screening: Colonoscopy. Completed 10/05/20. Repeat every 5 years  Lung Cancer Screening: (Low Dose CT Chest recommended if Age 69-80years, 30 pack-year currently smoking OR have quit w/in 15years.) does not qualify.   Lung Cancer Screening Referral: n/a   Additional Screening:  Hepatitis C Screening: does qualify; Completed 07/02/17  Vision Screening: Recommended annual ophthalmology exams for early detection of glaucoma and other disorders of the eye. Is the patient up to date with their annual eye exam?  Yes  Who is the provider or what is the name of the office in which the patient attends annual eye exams? [facility] [place] is not established with a provider, would they like to be referred to a provider to establish care? No .   Dental Screening: Recommended annual dental exams for proper oral hygiene  Community Resource Referral / Chronic Care Management: CRR required this visit?  No   CCM required this visit?  No      Plan:     I have personally reviewed and noted the following in the patient's chart:   Medical and social history Use of alcohol, tobacco or illicit drugs  Current medications and supplements including opioid prescriptions. Patient is not currently taking opioid prescriptions. Functional ability and status Nutritional status Physical activity Advanced directives List of other physicians Hospitalizations, surgeries, and ER visits in previous 12  months Vitals Screenings to include cognitive, depression, and falls Referrals and appointments  In addition, I have reviewed and discussed with patient certain preventive protocols, quality metrics, and best practice recommendations. A written personalized care plan for preventive services as well as general preventive health recommendations were provided to patient.     SVanetta Mulders [place]  [id number]  Due to this being a virtual visit, the after visit summary with patients personalized plan was offered to patient via mail or my-chart.  Patient would like to access on my-chart  [facility]: [facility]

## 2022-04-05 NOTE — Patient Instructions (Signed)
[name] , Thank you for taking time to come for your Medicare Wellness Visit. I appreciate your ongoing commitment to your health goals. Please review the following plan we discussed and let me know if I can assist you in the future.   These are the goals we discussed:  Goals   None     This is a list of the screening recommended for you and due dates:  Health Maintenance  Topic Date Due   DTaP/Tdap/Td vaccine (1 - Tdap) Never done   Zoster (Shingles) Vaccine (1 of 2) Never done   COVID-19 Vaccine (3 - 2023-24 season) 12/15/2021   Medicare Annual Wellness Visit  04/06/2023   Colon Cancer Screening  10/05/2025   Pneumonia Vaccine  Completed   Flu Shot  Completed   Hepatitis C Screening: USPSTF Recommendation to screen - Ages 18-79 yo.  Completed   HPV Vaccine  Aged Out    Advanced directives: Advance directive discussed with you today. I have provided a copy for you to complete at home and have notarized. Once this is complete please bring a copy in to our office so we can scan it into your chart.   Conditions/risks identified: Aim for 30 minutes of exercise or brisk walking, 6-8 glasses of water, and 5 servings of fruits and vegetables each day.   Next appointment: Follow up in one year for your annual wellness visit.   Preventive Care 28 Years and Older, Male  Preventive care refers to lifestyle choices and visits with your health care provider that can promote health and wellness. What does preventive care include? A yearly physical exam. This is also called an annual well check. Dental exams once or twice a year. Routine eye exams. Ask your health care provider how often you should have your eyes checked. Personal lifestyle choices, including: Daily care of your teeth and gums. Regular physical activity. Eating a healthy diet. Avoiding tobacco and drug use. Limiting alcohol use. Practicing safe sex. Taking low doses of aspirin every day. Taking vitamin and mineral  supplements as recommended by your health care provider. What happens during an annual well check? The services and screenings done by your health care provider during your annual well check will depend on your age, overall health, lifestyle risk factors, and family history of disease. Counseling  Your health care provider may ask you questions about your: Alcohol use. Tobacco use. Drug use. Emotional well-being. Home and relationship well-being. Sexual activity. Eating habits. History of falls. Memory and ability to understand (cognition). Work and work Statistician. Screening  You may have the following tests or measurements: Height, weight, and BMI. Blood pressure. Lipid and cholesterol levels. These may be checked every 5 years, or more frequently if you are over 51 years old. Skin check. Lung cancer screening. You may have this screening every year starting at age 40 if you have a 30-pack-year history of smoking and currently smoke or have quit within the past 15 years. Fecal occult blood test (FOBT) of the stool. You may have this test every year starting at age 90. Flexible sigmoidoscopy or colonoscopy. You may have a sigmoidoscopy every 5 years or a colonoscopy every 10 years starting at age 42. Prostate cancer screening. Recommendations will vary depending on your family history and other risks. Hepatitis C blood test. Hepatitis B blood test. Sexually transmitted disease (STD) testing. Diabetes screening. This is done by checking your blood sugar (glucose) after you have not eaten for a while (fasting). You may  have this done every 1-3 years. Abdominal aortic aneurysm (AAA) screening. You may need this if you are a current or former smoker. Osteoporosis. You may be screened starting at age 26 if you are at high risk. Talk with your health care provider about your test results, treatment options, and if necessary, the need for more tests. Vaccines  Your health care provider  may recommend certain vaccines, such as: Influenza vaccine. This is recommended every year. Tetanus, diphtheria, and acellular pertussis (Tdap, Td) vaccine. You may need a Td booster every 10 years. Zoster vaccine. You may need this after age 2. Pneumococcal 13-valent conjugate (PCV13) vaccine. One dose is recommended after age 69. Pneumococcal polysaccharide (PPSV23) vaccine. One dose is recommended after age 64. Talk to your health care provider about which screenings and vaccines you need and how often you need them. This information is not intended to replace advice given to you by your health care provider. Make sure you discuss any questions you have with your health care provider. Document Released: 04/29/2015 Document Revised: 12/21/2015 Document Reviewed: 02/01/2015 Elsevier Interactive Patient Education  2017 [place] Prevention in the Home Falls can cause injuries. They can happen to people of all ages. There are many things you can do to make your home safe and to help prevent falls. What can I do on the outside of my home? Regularly fix the edges of walkways and driveways and fix any cracks. Remove anything that might make you trip as you walk through a door, such as a raised step or threshold. Trim any bushes or trees on the path to your home. Use bright outdoor lighting. Clear any walking paths of anything that might make someone trip, such as rocks or tools. Regularly check to see if handrails are loose or broken. Make sure that both sides of any steps have handrails. Any raised decks and porches should have guardrails on the edges. Have any leaves, snow, or ice cleared regularly. Use sand or salt on walking paths during winter. Clean up any spills in your garage right away. This includes oil or grease spills. What can I do in the bathroom? Use night lights. Install grab bars by the toilet and in the tub and shower. Do not use towel bars as grab bars. Use  non-skid mats or decals in the tub or shower. If you need to sit down in the shower, use a plastic, non-slip stool. Keep the floor dry. Clean up any water that spills on the floor as soon as it happens. Remove soap buildup in the tub or shower regularly. Attach bath mats securely with double-sided non-slip rug tape. Do not have throw rugs and other things on the floor that can make you trip. What can I do in the bedroom? Use night lights. Make sure that you have a light by your bed that is easy to reach. Do not use any sheets or blankets that are too big for your bed. They should not hang down onto the floor. Have a firm chair that has side arms. You can use this for support while you get dressed. Do not have throw rugs and other things on the floor that can make you trip. What can I do in the kitchen? Clean up any spills right away. Avoid walking on wet floors. Keep items that you use a lot in easy-to-reach places. If you need to reach something above you, use a strong step stool that has a grab bar. Keep electrical cords  out of the way. Do not use floor polish or wax that makes floors slippery. If you must use wax, use non-skid floor wax. Do not have throw rugs and other things on the floor that can make you trip. What can I do with my stairs? Do not leave any items on the stairs. Make sure that there are handrails on both sides of the stairs and use them. Fix handrails that are broken or loose. Make sure that handrails are as long as the stairways. Check any carpeting to make sure that it is firmly attached to the stairs. Fix any carpet that is loose or worn. Avoid having throw rugs at the top or bottom of the stairs. If you do have throw rugs, attach them to the floor with carpet tape. Make sure that you have a light switch at the top of the stairs and the bottom of the stairs. If you do not have them, ask someone to add them for you. What else can I do to help prevent falls? Wear  shoes that: Do not have high heels. Have rubber bottoms. Are comfortable and fit you well. Are closed at the toe. Do not wear sandals. If you use a stepladder: Make sure that it is fully opened. Do not climb a closed stepladder. Make sure that both sides of the stepladder are locked into place. Ask someone to hold it for you, if possible. Clearly mark and make sure that you can see: Any grab bars or handrails. First and last steps. Where the edge of each step is. Use tools that help you move around (mobility aids) if they are needed. These include: Canes. Walkers. Scooters. Crutches. Turn on the lights when you go into a dark area. Replace any light bulbs as soon as they burn out. Set up your furniture so you have a clear path. Avoid moving your furniture around. If any of your floors are uneven, fix them. If there are any pets around you, be aware of where they are. Review your medicines with your doctor. Some medicines can make you feel dizzy. This can increase your chance of falling. Ask your doctor what other things that you can do to help prevent falls. This information is not intended to replace advice given to you by your health care provider. Make sure you discuss any questions you have with your health care provider. Document Released: 01/27/2009 Document Revised: 09/08/2015 Document Reviewed: 05/07/2014 Elsevier Interactive Patient Education  2017 [employer].

## 2022-04-12 ENCOUNTER — Encounter: Payer: Self-pay | Admitting: Family Medicine

## 2022-04-12 ENCOUNTER — Ambulatory Visit (INDEPENDENT_AMBULATORY_CARE_PROVIDER_SITE_OTHER): Payer: Medicare PPO | Admitting: Family Medicine

## 2022-04-12 VITALS — BP 132/70 | HR 70 | Ht 68.0 in | Wt 218.0 lb

## 2022-04-12 DIAGNOSIS — R55 Syncope and collapse: Secondary | ICD-10-CM

## 2022-04-12 NOTE — Progress Notes (Signed)
Subjective:    Patient ID: Stephen Butler, male    DOB: November 08, 1950, 71 y.o.   MRN: [id number]  HPI    10/26/21 Here for follow up.  Recently, the patient had to go to the emergency room for near syncope.  He states that he was in the yard working but for less than an hour.  He stood up and felt like he was going to pass out.  In the emergency room he was told that his potassium was low at 3 and they started him on a potassium supplement.  He states however that his blood pressures at home are typically 264-158 systolic.  They are better here but this is certainly too high.  He admits that he is eating a lot of salt and he also admits that he is not taking his second dose of clonidine.  His wife is a Marine scientist and that she is concerned that his "brain is not getting enough blood".  She feels that they need to repair the artery in his neck.  I have quoted above vascular surgery's recommendation that they would like to see a second vascular surgeon for a second opinion.  At that time, my plan was: Patient is having near syncopal episodes which sound like orthostatic hypotension however this is complicated by the fact that his blood pressure is extremely high with systolic blood pressures 309-407.  I think part of the problem is he is not consistently taking his clonidine.  Therefore we will discontinue clonidine and replace with better 24-hour coverage using doxazosin 4 mg a day.  I will recheck his blood pressure in 2 to 3 weeks.  Repeat a CMP to monitor his potassium also check a fasting lipid panel because I want his LDL cholesterol below 70.  I will refer the patient to vascular surgery for second opinion hopefully to help with his wife's mind and the patient's mind at ease.  I will certainly defer to their recommendations.  04/12/22 Admitted to outside hospital in [place] (DC Summary below): Hospital Course 71 year old male with a Hx of CAD, carotid artery stenosis, hypertension who to the ED due to  syncopal episodes.  See H&P for further details.   In the ED, patient was not a CODE STROKE. He was stated to have hypotension in the triage area but that does not seem to be documented. Vitals stable. CTA head and neck did not show any acute dissection or LVO. EKG showed NSR with a QTc of 475. Potassium was low. CXR showed no acute cardiopulmonary process. Hospitalist decided to admit for further evaluation.   Patient's recurrent syncope was felt to be due to a prolonged QT in conjunction with hypokalemia, hypovolemia/hypotension, and Ultram use. He had no further episodes after admission. His HCTZ was discontinued, and he was placed on Losartan. He will receive a TTE and a Zio patch will be placed prior to discharge.  Patient is here today for follow-up.  He is currently wearing a monitor however he has not met with cardiology.  He did stop his hydrochlorothiazide.  He is taking losartan.  His blood pressure today is excellent.  Patient states that since discharge from this hospital he has had 1 episode where he became lightheaded while bowling.  He denies any chest pain, shortness of breath, dyspnea on exertion.  He denies any seizure activity.  EKG today shows normal sinus rhythm with normal intervals and normal axis.  Specifically QTc is 420 ms which is normal.  Patient is  wearing Zio patch for 2 weeks.  Recently evaluated in clinic.  However he denies seeing a cardiologist and has not made any type of follow-up appointment.  Past Medical History:  Diagnosis Date   Arthritis    Epistaxis    GERD (gastroesophageal reflux disease)    Hematochezia    Hypertension    Microscopic hematuria    Neuromuscular disorder (HCC)    neuropathy   Stenosis of right carotid artery greater than 50%    Thyroid nodule    repeat US in 1 year (8/24)   Past Surgical History:  Procedure Laterality Date   COLONOSCOPY     SHOULDER SURGERY     Current Outpatient Medications on File Prior to Visit  Medication Sig  Dispense Refill   amLODipine (NORVASC) 10 MG tablet Take 1 tablet (10 mg total) by mouth daily. 90 tablet 0   aspirin EC 81 MG tablet Take by mouth.     atorvastatin (LIPITOR) 40 MG tablet Take by mouth.     cloNIDine (CATAPRES) 0.1 MG tablet Take 1 tablet by mouth twice daily 180 tablet 3   doxazosin (CARDURA) 4 MG tablet TAKE 1 TABLET BY MOUTH ONCE DAILY *STOP CLONIDINE* 30 tablet 0   losartan (COZAAR) 100 MG tablet Take 1 tablet (100 mg total) by mouth daily. Stop LISINOPRIL 90 tablet 3   meclizine (ANTIVERT) 25 MG tablet Take 1 tablet (25 mg total) by mouth 3 (three) times daily as needed for dizziness. 30 tablet 0   pantoprazole (PROTONIX) 40 MG tablet Take 1 tablet by mouth once daily 90 tablet 0   rosuvastatin (CRESTOR) 10 MG tablet Take 1 tablet (10 mg total) by mouth daily. 90 tablet 3   tadalafil (CIALIS) 20 MG tablet Take 0.5-1 tablets (10-20 mg total) by mouth every other day as needed for erectile dysfunction. 10 tablet 11   No current facility-administered medications on file prior to visit.      No Known Allergies Social History   Socioeconomic History   Marital status: Married    Spouse name: Not on file   Number of children: 0   Years of education: Not on file   Highest education level: Not on file  Occupational History   Occupation: retired  Tobacco Use   Smoking status: Never   Smokeless tobacco: Never  Vaping Use   Vaping Use: Never used  Substance and Sexual Activity   Alcohol use: No   Drug use: No   Sexual activity: Yes    Partners: Female  Other Topics Concern   Not on file  Social History Narrative   Not on file   Social Determinants of Health   Financial Resource Strain: Low Risk  (04/05/2022)   Overall Financial Resource Strain (CARDIA)    Difficulty of Paying Living Expenses: Not hard at all  Food Insecurity: No Food Insecurity (04/05/2022)   Hunger Vital Sign    Worried About Running Out of Food in the Last Year: Never true    [place] in the Last Year: Never true  Transportation Needs: No Transportation Needs (04/05/2022)   PRAPARE - Hydrologist (Medical): No    Lack of Transportation (Non-Medical): No  Physical Activity: Insufficiently Active (04/05/2022)   Exercise Vital Sign    Days of Exercise per Week: 3 days    Minutes of Exercise per Session: 30 min  Stress: No Stress Concern Present (04/05/2022)   [place] -  Occupational Stress Questionnaire    Feeling of Stress : Only a little  Social Connections: Socially Integrated (04/05/2022)   Social Connection and Isolation Panel [NHANES]    Frequency of Communication with Friends and Family: More than three times a week    Frequency of Social Gatherings with Friends and Family: Three times a week    Attends Religious Services: More than 4 times per year    Active Member of Clubs or Organizations: Yes    Attends Archivist Meetings: More than 4 times per year    Marital Status: Married  Human resources officer Violence: Not At Risk (04/05/2022)   Humiliation, Afraid, Rape, and Kick questionnaire    Fear of Current or Ex-Partner: No    Emotionally Abused: No    Physically Abused: No    Sexually Abused: No   Family History  Problem Relation Age of Onset   Hypertension Mother    Heart attack Mother    Stroke Father    Colon polyps Neg Hx    Colon cancer Neg Hx    Esophageal cancer Neg Hx    Rectal cancer Neg Hx    Stomach cancer Neg Hx      Review of Systems  All other systems reviewed and are negative.      Objective:   Physical Exam Vitals reviewed.  Constitutional:      General: He is not in acute distress.    Appearance: Normal appearance. He is well-developed and normal weight. He is not ill-appearing, toxic-appearing or diaphoretic.  HENT:     Head: Normocephalic and atraumatic.  Eyes:     Extraocular Movements: Extraocular movements intact.     Pupils: Pupils are equal,  round, and reactive to light.  Neck:     Vascular: No carotid bruit.  Cardiovascular:     Rate and Rhythm: Normal rate and regular rhythm.     Heart sounds: Normal heart sounds. No murmur heard.    No friction rub. No gallop.  Pulmonary:     Effort: Pulmonary effort is normal. No respiratory distress.     Breath sounds: Normal breath sounds. No stridor. No wheezing, rhonchi or rales.  Chest:     Chest wall: No tenderness.  Skin:    Coloration: Skin is not jaundiced.     Findings: No bruising, erythema, lesion or rash.  Neurological:     General: No focal deficit present.     Mental Status: He is alert and oriented to person, place, and time. Mental status is at baseline.     Cranial Nerves: No cranial nerve deficit.     Sensory: No sensory deficit.     Motor: No weakness or abnormal muscle tone.     Coordination: Coordination normal.     Gait: Gait normal.     Deep Tendon Reflexes: Reflexes are normal and symmetric.           Assessment & Plan:  Syncope, unspecified syncope type - Plan: EKG 12-Lead, COMPLETE METABOLIC PANEL WITH GFR, Magnesium Patient syncope sounds vasovagal however he had a prolonged QT interval.  I do not see any documentation of torsades in the hospital for ventricular ectopy.  He is currently wearing a cardiac monitor.  I want the patient to see cardiology to determine if he requires some type of ICD.  At the present time his QT interval has normalized.  His blood pressure is acceptable.  I will repeat his electrolytes and his magnesium.

## 2022-04-13 LAB — COMPLETE METABOLIC PANEL WITH GFR
AG Ratio: 1.2 (calc) (ref 1.0–2.5)
ALT: 18 U/L (ref 9–46)
AST: 19 U/L (ref 10–35)
Albumin: 4.1 g/dL (ref 3.6–5.1)
Alkaline phosphatase (APISO): 119 U/L (ref 35–144)
BUN: 9 mg/dL (ref 7–25)
CO2: 27 mmol/L (ref 20–32)
Calcium: 9.2 mg/dL (ref 8.6–10.3)
Chloride: 105 mmol/L (ref 98–110)
Creat: 1.11 mg/dL (ref 0.70–1.35)
Globulin: 3.4 g/dL (calc) (ref 1.9–3.7)
Glucose, Bld: 87 mg/dL (ref 65–99)
Potassium: 4 mmol/L (ref 3.5–5.3)
Sodium: 139 mmol/L (ref 135–146)
Total Bilirubin: 0.4 mg/dL (ref 0.2–1.2)
Total Protein: 7.5 g/dL (ref 6.1–8.1)
eGFR: 72 mL/min/{1.73_m2} (ref >=60)

## 2022-04-13 LAB — MAGNESIUM: Magnesium: 2.3 mg/dL (ref 1.5–2.5)

## 2022-04-14 ENCOUNTER — Other Ambulatory Visit: Payer: Self-pay | Admitting: Family Medicine

## 2022-04-14 NOTE — Telephone Encounter (Signed)
Requested Prescriptions  Pending Prescriptions Disp Refills   pantoprazole (PROTONIX) 40 MG tablet [Pharmacy Med Name: Pantoprazole Sodium 40 MG Oral Tablet Delayed Release] 90 tablet 0    Sig: Take 1 tablet by mouth once daily     Gastroenterology: Proton Pump Inhibitors Passed - 04/14/2022  5:38 AM      Passed - Valid encounter within last 12 months    Recent Outpatient Visits           9 months ago Bilateral carotid artery stenosis   [place] Susy Frizzle, MD   10 months ago Rib pain on left side   [place] [facility], Cammie Mcgee, MD   1 year ago Encounter for Medicare annual wellness exam   [place] Susy Frizzle, MD   1 year ago [place], Cammie Mcgee, MD   1 year ago [place] Pickard, Cammie Mcgee, MD

## 2022-04-15 ENCOUNTER — Other Ambulatory Visit: Payer: Self-pay | Admitting: Family Medicine

## 2022-04-19 DIAGNOSIS — R55 Syncope and collapse: Secondary | ICD-10-CM | POA: Diagnosis not present

## 2022-04-30 ENCOUNTER — Telehealth: Payer: Self-pay | Admitting: Family Medicine

## 2022-04-30 ENCOUNTER — Other Ambulatory Visit: Payer: Self-pay

## 2022-04-30 DIAGNOSIS — I10 Essential (primary) hypertension: Secondary | ICD-10-CM

## 2022-04-30 MED ORDER — LOSARTAN POTASSIUM 100 MG PO TABS: 100.0000 mg | ORAL_TABLET | Freq: Every day | ORAL | 3 refills | Status: DC

## 2022-04-30 NOTE — Telephone Encounter (Signed)
Prescription Request  04/30/2022  Is this a "Controlled Substance" medicine? No  LOV: 04/12/2022  What is the name of the medication or equipment?   losartan (COZAAR) 100 MG tablet   Have you contacted your pharmacy to request a refill? Yes   Which pharmacy would you like this sent to?  [facility], [place] [address] [place] [address] Phone: [phone] Fax: [phone]    Patient notified that their request is being sent to the clinical staff for review and that they should receive a response within 2 business days.   Please advise pharmacist at [phone].

## 2022-05-08 ENCOUNTER — Encounter: Payer: Self-pay | Admitting: *Deleted

## 2022-05-08 ENCOUNTER — Ambulatory Visit: Payer: Medicare PPO | Attending: Internal Medicine | Admitting: Internal Medicine

## 2022-05-08 ENCOUNTER — Telehealth: Payer: Self-pay | Admitting: *Deleted

## 2022-05-08 ENCOUNTER — Encounter: Payer: Self-pay | Admitting: Internal Medicine

## 2022-05-08 VITALS — BP 128/75 | HR 70 | Ht 68.0 in | Wt 227.0 lb

## 2022-05-08 DIAGNOSIS — R0683 Snoring: Secondary | ICD-10-CM | POA: Diagnosis not present

## 2022-05-08 DIAGNOSIS — R42 Dizziness and giddiness: Secondary | ICD-10-CM | POA: Diagnosis not present

## 2022-05-08 DIAGNOSIS — Q251 Coarctation of aorta: Secondary | ICD-10-CM | POA: Diagnosis not present

## 2022-05-08 DIAGNOSIS — G4709 Other insomnia: Secondary | ICD-10-CM

## 2022-05-08 NOTE — Patient Instructions (Addendum)
Medication Instructions:  Your physician recommends that you continue on your current medications as directed. Please refer to the Current Medication list given to you today.  Labwork: none  Testing/Procedures: WatchPat home sleep study  Follow-Up: Your physician recommends that you schedule a follow-up appointment in: 6 months  Any Other Special Instructions Will Be Listed Below (If Applicable).  If you need a refill on your cardiac medications before your next appointment, please call your pharmacy.

## 2022-05-08 NOTE — Telephone Encounter (Signed)
Per VM-Could you please schedule nurse visit to measure blood pressures in all the 4 limbs due to incidental finding of coarctation of aorta on CT scan. I spoke with the wife now and she will get the records of echocardiogram and CT angio of head from outside hospitals and send it over to [place]. I edited my note to reflect the current plan.

## 2022-05-08 NOTE — Progress Notes (Signed)
Please check precert

## 2022-05-08 NOTE — Progress Notes (Addendum)
Cardiology Office Note  Date: 05/08/2022   ID: Draedyn Weidinger, DOB Jan 02, 1951, MRN [id number]  PCP:  Susy Frizzle, MD  Cardiologist:  Chalmers Guest, MD Electrophysiologist:  None   Reason for Office Visit: Evaluation of syncope at the request of Dr. Dennard Schaumann   History of Present Illness: Stephen Butler is a 72 y.o. male known to have HTN, R ICA stenosis (70% per USG and 50 to 60% per CT scan, followed by vascular [facility] at [facility]) and L ICA stenosis 40 to 59% stenosis, new incidental finding of post ductal coarctation of aorta with no evidence of collaterals on CTA chest from 12/2021, multiple thyroid nodules presented to the cardiology clinic for evaluation of syncope.  Patient has been having intermittent dizziness but started to have syncopal episodes since June 2023 when he started to have a prodrome of dizziness followed by eyes rolling out, brief loss of consciousness but regained within a few seconds. The next episode was around September and December 2023 for which he was sent to [facility] and admitted. EKG per wife showed long QT syndrome. He was on tramadol and HCTZ till that time and after the medications were discontinued, he did not have any more dizzy spells. He underwent event monitor which showed no evidence of arrhythmias or pauses or AV blocks that could his symptoms of dizzy spells. Denies any angina, DOE, leg swelling.  Patient was never tested for OSA in the past and has symptoms of snoring, morning naps and sometimes has morning headaches.  Denies smoking status.  Past Medical History:  Diagnosis Date   Arthritis    Epistaxis    GERD (gastroesophageal reflux disease)    Hematochezia    Hypertension    Microscopic hematuria    Neuromuscular disorder (HCC)    neuropathy   Stenosis of right carotid artery greater than 50%    Thyroid nodule    repeat US in 1 year (8/24)    Past Surgical History:  Procedure Laterality Date    COLONOSCOPY     SHOULDER SURGERY      Current Outpatient Medications  Medication Sig Dispense Refill   amLODipine (NORVASC) 10 MG tablet Take 1 tablet (10 mg total) by mouth daily. 90 tablet 0   aspirin EC 81 MG tablet Take by mouth.     doxazosin (CARDURA) 4 MG tablet TAKE 1 TABLET BY MOUTH ONCE DAILY *STOP CLONIDINE* 30 tablet 0   losartan (COZAAR) 50 MG tablet Take 50 mg by mouth daily.     meclizine (ANTIVERT) 25 MG tablet Take 1 tablet (25 mg total) by mouth 3 (three) times daily as needed for dizziness. 30 tablet 0   Multiple Vitamin (MULTIVITAMIN) capsule Take 1 capsule by mouth daily.     pantoprazole (PROTONIX) 40 MG tablet Take 1 tablet by mouth once daily 90 tablet 0   rosuvastatin (CRESTOR) 10 MG tablet Take 1 tablet (10 mg total) by mouth daily. 90 tablet 3   tadalafil (CIALIS) 20 MG tablet Take 0.5-1 tablets (10-20 mg total) by mouth every other day as needed for erectile dysfunction. 10 tablet 11   No current facility-administered medications for this visit.   Allergies:  Patient has no known allergies.   Social History: The patient  reports that he has never smoked. He has never used smokeless tobacco. He reports that he does not drink alcohol and does not use drugs.   Family History: The patient's family history includes Heart attack in  his mother; Hypertension in his mother; Stroke in his father.   ROS:  Please see the history of present illness. Otherwise, complete review of systems is positive for none.  All other systems are reviewed and negative.   Physical Exam: VS:  BP 128/75   Pulse 70   Ht '5\' 8"'$  (1.727 m)   Wt 227 lb (103 kg)   SpO2 98%   BMI 34.52 kg/m , BMI Body mass index is 34.52 kg/m.  Wt Readings from Last 3 Encounters:  05/08/22 227 lb (103 kg)  04/12/22 218 lb (98.9 kg)  04/05/22 218 lb (98.9 kg)    General: Patient appears comfortable at rest. HEENT: Conjunctiva and lids normal, oropharynx clear with moist mucosa. Neck: Supple, no elevated  JVP or carotid bruits, no thyromegaly. Lungs: Clear to auscultation, nonlabored breathing at rest. Cardiac: Regular rate and rhythm, no S3 or significant systolic murmur, no pericardial rub. Abdomen: Soft, nontender, no hepatomegaly, bowel sounds present, no guarding or rebound. Extremities: No pitting edema, distal pulses 2+. Skin: Warm and dry. Musculoskeletal: No kyphosis. Neuropsychiatric: Alert and oriented x3, affect grossly appropriate.  ECG:  An ECG dated 05/08/2022 was personally reviewed today and demonstrated:  Normal sinus rhythm and no ST changes  Recent Labwork: 06/12/2021: Hemoglobin 14.3; Platelets 181 04/12/2022: ALT 18; AST 19; BUN 9; Creat 1.11; Magnesium 2.3; Potassium 4.0; Sodium 139     Component Value Date/Time   CHOL 205 (H) 10/26/2021 1428   TRIG 132 10/26/2021 1428   HDL 55 10/26/2021 1428   CHOLHDL 3.7 10/26/2021 1428   LDLCALC 125 (H) 10/26/2021 1428   LDLDIRECT 63 07/06/2019 1558    Other Studies Reviewed Today: I personally reviewed care everywhere notes  Assessment and Plan: Patient is a 72 year old M known to have HTN, R ICA stenosis (70% per USG and 50 to 60% per CT scan, followed by vascular surgery at [facility]) and L ICA stenosis 40 to 59% stenosis, new incidental finding of post ductal coarctation of aorta with no evidence of collaterals on CTA chest from 12/2021, multiple thyroid nodules presented to the cardiology clinic for evaluation of syncope.  # Dizziness # Syncope likely drug-induced -Patient did not have any more dizzy spells after discontinuing tramadol and diuretic, HCTZ.  Event monitor showed no evidence of pauses or AV blocks or arrhythmias.  Orthostatic chills today in the clinic showed no evidence of orthostatic hypotension or POTS. Patient is instructed to observe for any more dizzy spells.  # New incidental finding of post ductal coarctation of aorta with no evidence of collaterals on CT chest from 12/2021: Patient's wife  stated that he underwent echocardiogram at [place] (I am not able to find it). Nurse visit in 1 to 2 days for measurement of blood pressures in both upper extremities and lower extremities. She also stated that he underwent CTA head with contrast at [facility]. If there is no evidence of intracranial aneurysms, he will need to get exercise tolerance test prior to starting exercise regimen to rule out exercise-induced HTN. No high intensity exercise as of now. Patient's wife will obtain medical records from the above places and send them in.  # HTN, poorly controlled -Continue amlodipine 10 mg once daily, losartan 50 mg once daily and his PCP recently started him on doxazosin 4 mg once daily. HTN management per PCP. -Obtain home sleep study for OSA evaluation  # Snoring # Morning naps -Obtain home sleep study for OSA evaluation.  #  Bilateral carotid artery stenosis more than 50%, asymptomatic -Patient is educated about the symptoms of significant carotid stenosis including neurological deficits and dysarthria. Dizziness or dizzy spells is not a symptom of carotid stenosis. Follow-up with vascular surgery. -Continue aspirin 81 mg once daily and rosuvastatin 10 mg nightly (decreased the dose due to myalgias).  I have spent a total of 45 minutes with patient reviewing chart, EKGs, labs and examining patient as well as establishing an assessment and plan that was discussed with the patient.  > 50% of time was spent in direct patient care.    Medication Adjustments/Labs and Tests Ordered: Current medicines are reviewed at length with the patient today.  Concerns regarding medicines are outlined above.   Tests Ordered: Orders Placed This Encounter  Procedures   [place] Sleep Study    Medication Changes: No orders of the defined types were placed in this encounter.   Disposition:  Follow up  3 months  Signed Channing Savich Fidel Levy, MD, 05/08/2022 2:10 PM    [facility] [place], [place], [place] [address]

## 2022-05-09 DIAGNOSIS — M5416 Radiculopathy, lumbar region: Secondary | ICD-10-CM | POA: Insufficient documentation

## 2022-05-13 ENCOUNTER — Other Ambulatory Visit: Payer: Self-pay | Admitting: Family Medicine

## 2022-05-14 ENCOUNTER — Telehealth: Payer: Self-pay | Admitting: *Deleted

## 2022-05-14 NOTE — Telephone Encounter (Signed)
Prior Authorization for [facility] sent to [facility] via Phone. Reference # .  READY- NO PA REQ

## 2022-05-14 NOTE — Telephone Encounter (Signed)
OV 04/12/22 Requested Prescriptions  Pending Prescriptions Disp Refills   doxazosin (CARDURA) 4 MG tablet [Pharmacy Med Name: Doxazosin Mesylate 4 MG Oral Tablet] 30 tablet 0    Sig: TAKE 1 TABLET BY MOUTH ONCE DAILY *STOP CLONIDINE*     Cardiovascular:  Alpha Blockers Failed - 05/13/2022  6:39 AM      Failed - Valid encounter within last 6 months    Recent Outpatient Visits           10 months ago Bilateral carotid artery stenosis   [place] Susy Frizzle, MD   11 months ago Rib pain on left side   [place] Pickard, Cammie Mcgee, MD   1 year ago Encounter for Medicare annual wellness exam   [place] Pickard, Cammie Mcgee, MD   1 year ago [place] Dennard Schaumann, Cammie Mcgee, MD   1 year ago [place], Cammie Mcgee, MD       Future Appointments             In 6 months Mallipeddi, Quenten Raven, MD [place] at [place], [place] BP in normal range    BP Readings from Last 1 Encounters:  05/08/22 128/75

## 2022-05-16 NOTE — Progress Notes (Signed)
You may proceed with your sleep study Please come to the office to pick up your device The Pin Code is [id number]

## 2022-05-16 NOTE — Telephone Encounter (Signed)
You may proceed with your sleep study Please come to the office to pick up your device The Pin Code is [id number]  Patient informed and verbalized understanding of plan.

## 2022-05-18 ENCOUNTER — Encounter: Payer: Self-pay | Admitting: *Deleted

## 2022-05-18 ENCOUNTER — Ambulatory Visit: Payer: Medicare PPO | Attending: Cardiology | Admitting: *Deleted

## 2022-05-18 VITALS — BP 201/131 | HR 91 | Ht 68.0 in | Wt 229.6 lb

## 2022-05-18 DIAGNOSIS — Q251 Coarctation of aorta: Secondary | ICD-10-CM

## 2022-05-18 NOTE — Progress Notes (Signed)
Presents for nurse visit for BP's on all 4 limbs per VM and incidental finding of coarctation of aorta seen on CT. Medications reviewed. Reports taking all doses of medications as prescribed without missing doses or side effects. Denies dizziness, chest pain or SOB. Vitals taken including blood pressure checks in all 4 limbs and routed to provider for review. Says he has an appointment next week 05/28/2022 at [facility] with Cardiologist made by PCP.

## 2022-05-21 ENCOUNTER — Encounter (INDEPENDENT_AMBULATORY_CARE_PROVIDER_SITE_OTHER): Payer: Medicare PPO | Admitting: Cardiology

## 2022-05-21 ENCOUNTER — Telehealth: Payer: Self-pay | Admitting: Internal Medicine

## 2022-05-21 DIAGNOSIS — G4733 Obstructive sleep apnea (adult) (pediatric): Secondary | ICD-10-CM | POA: Diagnosis not present

## 2022-05-21 NOTE — Telephone Encounter (Signed)
Mychart message sent.

## 2022-05-21 NOTE — Telephone Encounter (Signed)
Patient calling in to get sleep study code. Please advise

## 2022-05-21 NOTE — Telephone Encounter (Signed)
Left patient a message to give office a call back/mychart message sent to pt.

## 2022-05-22 ENCOUNTER — Encounter: Payer: Self-pay | Admitting: Internal Medicine

## 2022-05-22 NOTE — Procedures (Unsigned)
SLEEP STUDY REPORT Patient Information Study Date: 05/21/2022 Patient Name: Stephen Butler Patient ID: [id number] Birth Date: Jul 29, 1950 Age: 72 Gender: Male BMI: 34.4 (W=227 lb, H=5' 8'') Referring Physician: Claudina Lick, MD  TEST DESCRIPTION: Home sleep apnea testing was completed using the WatchPat, a Type 1 device, utilizing peripheral arterial tonometry (PAT), chest movement, actigraphy, pulse oximetry, pulse rate, body position and snore. AHI was calculated with apnea and hypopnea using valid sleep time as the denominator. RDI includes apneas, hypopneas, and RERAs. The data acquired and the scoring of sleep and all associated events were performed in accordance with the recommended standards and specifications as outlined in the AASM Manual for the Scoring of Sleep and Associated Events 2.2.0 (2015).   FINDINGS:   1. Mild Obstructive Sleep Apnea with AHI 6.2/hr.   2. No Central Sleep Apnea with pAHIc 1.5/hr.   3. Oxygen desaturations as low as 90%.   4. Moderate snoring was present. O2 sats were < 88% for 0 min.   5. Total sleep time was 8 hrs and 0 min.   6. 21.5% of total sleep time was spent in REM sleep.   7. Shortened sleep onset latency at 6 min.   8. Normal REM sleep onset latency at 91 min.   9. Total awakenings were 14.  10. Arrhythmia detection:  None.  DIAGNOSIS: Mild Obstructive Sleep Apnea (G47.33)  RECOMMENDATIONS:   1.  Clinical correlation of these findings is necessary.  The decision to treat obstructive sleep apnea (OSA) is usually based on the presence of apnea symptoms or the presence of associated medical conditions such as Hypertension, Congestive Heart Failure, Atrial Fibrillation or Obesity.  The most common symptoms of OSA are snoring, gasping for breath while sleeping, daytime sleepiness and fatigue.   2.  Initiating apnea therapy is recommended given the presence of symptoms and/or associated conditions. Recommend proceeding with one of the  following:     a.  Auto-CPAP therapy with a pressure range of 5-20cm H2O.     b.  An oral appliance (OA) that can be obtained from certain dentists with expertise in sleep medicine.  These are primarily of use in non-obese patients with mild and moderate disease.     c.  An ENT consultation which may be useful to look for specific causes of obstruction and possible treatment options.     d.  If patient is intolerant to PAP therapy, consider referral to ENT for evaluation for hypoglossal nerve stimulator.   3.  Close follow-up is necessary to ensure success with CPAP or oral appliance therapy for maximum benefit.  4.  A follow-up oximetry study on CPAP is recommended to assess the adequacy of therapy and determine the need for supplemental oxygen or the potential need for Bi-level therapy.  An arterial blood gas to determine the adequacy of baseline ventilation and oxygenation should also be considered.  5.  Healthy sleep recommendations include:  adequate nightly sleep (normal 7-9 hrs/night), avoidance of caffeine after noon and alcohol near bedtime, and maintaining a sleep environment that is cool, dark and quiet.  6.  Weight loss for overweight patients is recommended.  Even modest amounts of weight loss can significantly improve the severity of sleep apnea.  7.  Snoring recommendations include:  weight loss where appropriate, side sleeping, and avoidance of alcohol before bed.  8.  Operation of motor vehicle should be avoided when sleepy.  Signature: Fransico Him, MD; [facility]; [place], [place] Board of Sleep Medicine Electronically Signed: 05/23/2022

## 2022-05-23 ENCOUNTER — Ambulatory Visit: Payer: Medicare PPO | Attending: Internal Medicine

## 2022-05-23 DIAGNOSIS — G4709 Other insomnia: Secondary | ICD-10-CM

## 2022-05-23 DIAGNOSIS — R0683 Snoring: Secondary | ICD-10-CM

## 2022-05-25 ENCOUNTER — Telehealth (HOSPITAL_COMMUNITY): Payer: Self-pay

## 2022-05-25 ENCOUNTER — Ambulatory Visit: Payer: Medicare PPO | Admitting: Internal Medicine

## 2022-05-25 NOTE — Telephone Encounter (Signed)
Patient recall being CX per patient, is being followed by [facility]

## 2022-05-30 ENCOUNTER — Ambulatory Visit (HOSPITAL_COMMUNITY): Payer: Medicare PPO

## 2022-05-30 ENCOUNTER — Ambulatory Visit: Payer: Medicare PPO

## 2022-05-31 ENCOUNTER — Telehealth: Payer: Self-pay | Admitting: *Deleted

## 2022-05-31 NOTE — Telephone Encounter (Signed)
-----   Message from Lauralee Evener, [place] sent at 05/23/2022  8:42 AM EST -----  ----- Message ----- From: Sueanne Margarita, MD Sent: 05/22/2022   8:42 PM EST To: Cv Div Sleep Studies  Patient has very mild OSA - set up OV to discuss treatment options.

## 2022-05-31 NOTE — Telephone Encounter (Signed)
The patient has been notified of the result and verbalized understanding.  All questions (if any) were answered. Stephen Butler, [place] 05/31/2022 5:50 PM    Patient and wife will discuss his results and call us back.

## 2022-06-05 ENCOUNTER — Other Ambulatory Visit: Payer: Self-pay | Admitting: Family Medicine

## 2022-06-05 DIAGNOSIS — R079 Chest pain, unspecified: Secondary | ICD-10-CM

## 2022-06-20 ENCOUNTER — Other Ambulatory Visit: Payer: Self-pay | Admitting: Family Medicine

## 2022-06-20 NOTE — Telephone Encounter (Signed)
Requested medication (s) are due for refill today - yes  Requested medication (s) are on the active medication list -yes  Future visit scheduled -no  Last refill: 06/16/21 #[id number]  Notes to clinic: Not sure why they are requesting this dosage with 1/2 tablet instruction- not listed like that in chart  Requested Prescriptions  Pending Prescriptions Disp Refills   rosuvastatin (CRESTOR) 20 MG tablet [Pharmacy Med Name: Rosuvastatin Calcium 20 MG Oral Tablet] 45 tablet 0    Sig: Take 1/2 (one-half) tablet by mouth once daily     Cardiovascular:  Antilipid - Statins 2 Failed - 06/20/2022 11:32 AM      Failed - Lipid Panel in normal range within the last 12 months    Cholesterol  Date Value Ref Range Status  10/26/2021 205 (H) <200 mg/dL Final   LDL Cholesterol (Calc)  Date Value Ref Range Status  10/26/2021 125 (H) mg/dL (calc) Final    Comment:    Reference range: <100 . Desirable range <100 mg/dL for primary prevention;   <70 mg/dL for patients with CHD or diabetic patients  with > or = 2 CHD risk factors. [place] Kitchen LDL-C is now calculated using the Martin-Hopkins  calculation, which is a validated novel method providing  better accuracy than the Friedewald equation in the  estimation of LDL-C.  Cresenciano Genre et al. Annamaria Helling. [id number]): 2061-2068  (http://education.[facility].com/faq/FAQ164)    Direct LDL  Date Value Ref Range Status  07/06/2019 63 <100 mg/dL Final    Comment:    Greatly elevated Triglycerides values (>1200 mg/dL) interfere with the dLDL assay. As no Triglycerides  testing was ordered, interpret results with caution. . Desirable range <100 mg/dL for primary prevention;   <70 mg/dL for patients with CHD or diabetic patients  with > or = 2 CHD risk factors. Marland Kitchen    HDL  Date Value Ref Range Status  10/26/2021 55 > OR = 40 mg/dL Final   Triglycerides  Date Value Ref Range Status  10/26/2021 132 <150 mg/dL Final         Passed - Cr in normal range and  within 360 days    Creat  Date Value Ref Range Status  04/12/2022 1.11 0.70 - 1.35 mg/dL Final         Passed - Patient is not pregnant      Passed - Valid encounter within last 12 months    Recent Outpatient Visits           11 months ago Bilateral carotid artery stenosis   [place] Susy Frizzle, MD   1 year ago Rib pain on left side   [place] Pickard, Cammie Mcgee, MD   1 year ago Encounter for Medicare annual wellness exam   [place] Pickard, Cammie Mcgee, MD   1 year ago [place] Dennard Schaumann, Cammie Mcgee, MD   1 year ago [place] Pickard, Cammie Mcgee, MD       Future Appointments             In 4 months Mallipeddi, Quenten Raven, MD [place] at [place], [place]               Requested Prescriptions  Pending Prescriptions Disp Refills   rosuvastatin (CRESTOR) 20 MG tablet [Pharmacy Med Name: Rosuvastatin Calcium 20 MG Oral Tablet] 45 tablet 0    Sig: Take 1/2 (one-half) tablet by mouth  once daily     Cardiovascular:  Antilipid - Statins 2 Failed - 06/20/2022 11:32 AM      Failed - Lipid Panel in normal range within the last 12 months    Cholesterol  Date Value Ref Range Status  10/26/2021 205 (H) <200 mg/dL Final   LDL Cholesterol (Calc)  Date Value Ref Range Status  10/26/2021 125 (H) mg/dL (calc) Final    Comment:    Reference range: <100 . Desirable range <100 mg/dL for primary prevention;   <70 mg/dL for patients with CHD or diabetic patients  with > or = 2 CHD risk factors. Mar[place] Kitchen LDL-C is now calculated using the Martin-Hopkins  calculation, which is a validated novel method providing  better accuracy than the Friedewald equation in the  estimation of LDL-C.  Cresenciano Genre et al. Annamaria Helling. [id number]): 2061-2068  (http://education.[facility].com/faq/FAQ164)    Direct LDL  Date Value Ref Range Status  07/06/2019 63 <100 mg/dL Final     Comment:    Greatly elevated Triglycerides values (>1200 mg/dL) interfere with the dLDL assay. As no Triglycerides  testing was ordered, interpret results with caution. . Desirable range <100 mg/dL for primary prevention;   <70 mg/dL for patients with CHD or diabetic patients  with > or = 2 CHD risk factors. Marland Kitchen    HDL  Date Value Ref Range Status  10/26/2021 55 > OR = 40 mg/dL Final   Triglycerides  Date Value Ref Range Status  10/26/2021 132 <150 mg/dL Final         Passed - Cr in normal range and within 360 days    Creat  Date Value Ref Range Status  04/12/2022 1.11 0.70 - 1.35 mg/dL Final         Passed - Patient is not pregnant      Passed - Valid encounter within last 12 months    Recent Outpatient Visits           11 months ago Bilateral carotid artery stenosis   [place] Susy Frizzle, MD   1 year ago Rib pain on left side   [place] Pickard, Cammie Mcgee, MD   1 year ago Encounter for Medicare annual wellness exam   [place] Susy Frizzle, MD   1 year ago [place], Cammie Mcgee, MD   1 year ago [place], Cammie Mcgee, MD       Future Appointments             In 4 months Mallipeddi, Quenten Raven, MD [place] at [place], [place]

## 2022-07-12 ENCOUNTER — Other Ambulatory Visit: Payer: Self-pay | Admitting: Family Medicine

## 2022-09-01 ENCOUNTER — Other Ambulatory Visit: Payer: Self-pay | Admitting: Family Medicine

## 2022-09-01 DIAGNOSIS — R079 Chest pain, unspecified: Secondary | ICD-10-CM

## 2022-10-04 ENCOUNTER — Ambulatory Visit (INDEPENDENT_AMBULATORY_CARE_PROVIDER_SITE_OTHER): Payer: Medicare PPO | Admitting: Family Medicine

## 2022-10-04 ENCOUNTER — Encounter: Payer: Self-pay | Admitting: Family Medicine

## 2022-10-04 VITALS — BP 124/76 | HR 67 | Temp 97.8°F | Ht 68.0 in | Wt 221.0 lb

## 2022-10-04 DIAGNOSIS — I10 Essential (primary) hypertension: Secondary | ICD-10-CM

## 2022-10-04 DIAGNOSIS — N529 Male erectile dysfunction, unspecified: Secondary | ICD-10-CM | POA: Diagnosis not present

## 2022-10-04 DIAGNOSIS — G2581 Restless legs syndrome: Secondary | ICD-10-CM | POA: Diagnosis not present

## 2022-10-04 DIAGNOSIS — R9431 Abnormal electrocardiogram [ECG] [EKG]: Secondary | ICD-10-CM | POA: Insufficient documentation

## 2022-10-04 DIAGNOSIS — I6521 Occlusion and stenosis of right carotid artery: Secondary | ICD-10-CM | POA: Diagnosis not present

## 2022-10-04 DIAGNOSIS — I6509 Occlusion and stenosis of unspecified vertebral artery: Secondary | ICD-10-CM | POA: Insufficient documentation

## 2022-10-04 DIAGNOSIS — I7123 Aneurysm of the descending thoracic aorta, without rupture: Secondary | ICD-10-CM

## 2022-10-04 DIAGNOSIS — R55 Syncope and collapse: Secondary | ICD-10-CM | POA: Insufficient documentation

## 2022-10-04 LAB — COMPLETE METABOLIC PANEL WITH GFR
AG Ratio: 1.6 (calc) (ref 1.0–2.5)
ALT: 21 U/L (ref 9–46)
AST: 22 U/L (ref 10–35)
Albumin: 4.4 g/dL (ref 3.6–5.1)
Alkaline phosphatase (APISO): 159 U/L — ABNORMAL HIGH (ref 35–144)
BUN: 9 mg/dL (ref 7–25)
CO2: 26 mmol/L (ref 20–32)
Calcium: 9.6 mg/dL (ref 8.6–10.3)
Chloride: 107 mmol/L (ref 98–110)
Creat: 1.08 mg/dL (ref 0.70–1.28)
Globulin: 2.8 g/dL (calc) (ref 1.9–3.7)
Glucose, Bld: 88 mg/dL (ref 65–99)
Potassium: 3.9 mmol/L (ref 3.5–5.3)
Sodium: 141 mmol/L (ref 135–146)
Total Bilirubin: 0.5 mg/dL (ref 0.2–1.2)
Total Protein: 7.2 g/dL (ref 6.1–8.1)
eGFR: 74 mL/min/{1.73_m2} (ref >=60)

## 2022-10-04 LAB — CBC WITH DIFFERENTIAL/PLATELET
Absolute Monocytes: 550 cells/uL (ref 200–950)
Basophils Absolute: 32 cells/uL (ref 0–200)
Basophils Relative: 0.5 %
Eosinophils Absolute: 128 cells/uL (ref 15–500)
Eosinophils Relative: 2 %
HCT: 41.2 % (ref 38.5–50.0)
Hemoglobin: 13.4 g/dL (ref 13.2–17.1)
Lymphs Abs: 3187 cells/uL (ref 850–3900)
MCH: 26.2 pg — ABNORMAL LOW (ref 27.0–33.0)
MCHC: 32.5 g/dL (ref 32.0–36.0)
MCV: 80.6 fL (ref 80.0–100.0)
MPV: 11.9 fL (ref 7.5–12.5)
Monocytes Relative: 8.6 %
Neutro Abs: 2502 cells/uL (ref 1500–7800)
Neutrophils Relative %: 39.1 %
Platelets: 202 10*3/uL (ref 140–400)
RBC: 5.11 10*6/uL (ref 4.20–5.80)
RDW: 14.2 % (ref 11.0–15.0)
Total Lymphocyte: 49.8 %
WBC: 6.4 10*3/uL (ref 3.8–10.8)

## 2022-10-04 LAB — LIPID PANEL
Cholesterol: 136 mg/dL (ref <200)
HDL: 55 mg/dL (ref >=40)
LDL Cholesterol (Calc): 60 mg/dL (calc)
Non-HDL Cholesterol (Calc): 81 mg/dL (calc) (ref <130)
Total CHOL/HDL Ratio: 2.5 (calc) (ref <5.0)
Triglycerides: 133 mg/dL (ref <150)

## 2022-10-04 MED ORDER — EDEX 10 MCG IC KIT: 2.5000 ug | PACK | INTRACAVERNOUS | 12 refills | Status: AC | PRN

## 2022-10-04 MED ORDER — PRAMIPEXOLE DIHYDROCHLORIDE 0.125 MG PO TABS: 0.2500 mg | ORAL_TABLET | Freq: Every day | ORAL | 1 refills | Status: AC

## 2022-10-04 NOTE — Addendum Note (Signed)
Addended by: Lynnea Ferrier T on: 10/04/2022 02:27 PM   Modules accepted: Orders

## 2022-10-04 NOTE — Progress Notes (Addendum)
Subjective:    Patient ID: Stephen Butler, male    DOB: December 12, 1950, 72 y.o.   MRN: [id number]  HPI  04/12/22 Admitted to outside hospital in [place] (DC Summary below): Hospital Course 72 year old male with a Hx of CAD, carotid artery stenosis, hypertension who to the ED due to syncopal episodes.  See H&P for further details.   In the ED, patient was not a CODE STROKE. He was stated to have hypotension in the triage area but that does not seem to be documented. Vitals stable. CTA head and neck did not show any acute dissection or LVO. EKG showed NSR with a QTc of 475. Potassium was low. CXR showed no acute cardiopulmonary process. Hospitalist decided to admit for further evaluation.   Patient's recurrent syncope was felt to be due to a prolonged QT in conjunction with hypokalemia, hypovolemia/hypotension, and Ultram use. He had no further episodes after admission. His HCTZ was discontinued, and he was placed on Losartan. He will receive a TTE and a Zio patch will be placed prior to discharge.  Patient is here today for follow-up.  He is currently wearing a monitor however he has not met with cardiology.  He did stop his hydrochlorothiazide.  He is taking losartan.  His blood pressure today is excellent.  Patient states that since discharge from this hospital he has had 1 episode where he became lightheaded while bowling.  He denies any chest pain, shortness of breath, dyspnea on exertion.  He denies any seizure activity.  EKG today shows normal sinus rhythm with normal intervals and normal axis.  Specifically QTc is 420 ms which is normal.  Patient is wearing Zio patch for 2 weeks.  Recently evaluated in clinic.  However he denies seeing a cardiologist and has not made any type of follow-up appointment.  At that time, my plan was: Patient syncope sounds vasovagal however he had a prolonged QT interval.  I do not see any documentation of torsades in the hospital for ventricular ectopy.  He is currently  wearing a cardiac monitor.  I want the patient to see cardiology to determine if he requires some type of ICD.  At the present time his QT interval has normalized.  His blood pressure is acceptable.  I will repeat his electrolytes and his magnesium.  10/04/22 Patient is here today for follow-up.  He has 2 concerns.  First he reports of burning sensation in his legs at night.  He states it gets better if he moves them.  He has to constantly move them back and forth from under the sheets.  At times they get too hot and burning at other times they feel better and are too cold.  He also gets some burning pain in his feet and his legs later during the day but primarily at night when he is trying to lie still.  Movement seems to help this.  Second issue is erectile dysfunction.  He has tried Cialis as well as Viagra with no success.  His brother has had success with alprostadil intracavernosal injections.  The patient is interested in trying this.  He denies any angina.  His blood pressure today is outstanding.  He saw cardiology at [facility].  They are following a thoracic aortic aneurysm.  He is also seeing his vascular surgeons to monitor his carotid artery stenosis.  Past Medical History:  Diagnosis Date   Arthritis    Epistaxis    GERD (gastroesophageal reflux disease)    Hematochezia  Hypertension    Microscopic hematuria    Neuromuscular disorder (HCC)    neuropathy   Stenosis of right carotid artery greater than 50%    Thyroid nodule    repeat US in 1 year (8/24)   Past Surgical History:  Procedure Laterality Date   COLONOSCOPY     SHOULDER SURGERY     Current Outpatient Medications on File Prior to Visit  Medication Sig Dispense Refill   amLODipine (NORVASC) 10 MG tablet Take 1 tablet by mouth once daily 90 tablet 0   aspirin EC 81 MG tablet Take by mouth.     doxazosin (CARDURA) 4 MG tablet TAKE 1 TABLET BY MOUTH ONCE DAILY *STOP CLONIDINE* 30 tablet 0   losartan (COZAAR) 50 MG tablet  Take 50 mg by mouth daily.     meclizine (ANTIVERT) 25 MG tablet Take 1 tablet (25 mg total) by mouth 3 (three) times daily as needed for dizziness. 30 tablet 0   Multiple Vitamin (MULTIVITAMIN) capsule Take 1 capsule by mouth daily.     pantoprazole (PROTONIX) 40 MG tablet Take 1 tablet by mouth once daily 90 tablet 0   rosuvastatin (CRESTOR) 20 MG tablet Take 1/2 (one-half) tablet by mouth once daily 45 tablet 1   tadalafil (CIALIS) 20 MG tablet Take 0.5-1 tablets (10-20 mg total) by mouth every other day as needed for erectile dysfunction. 10 tablet 11   No current facility-administered medications on file prior to visit.      No Known Allergies Social History   Socioeconomic History   Marital status: Married    Spouse name: Not on file   Number of children: 0   Years of education: Not on file   Highest education level: Not on file  Occupational History   Occupation: retired  Tobacco Use   Smoking status: Never   Smokeless tobacco: Never  Vaping Use   Vaping Use: Never used  Substance and Sexual Activity   Alcohol use: No   Drug use: No   Sexual activity: Yes    Partners: Female  Other Topics Concern   Not on file  Social History Narrative   Not on file   Social Determinants of Health   Financial Resource Strain: Low Risk  (04/05/2022)   Overall Financial Resource Strain (CARDIA)    Difficulty of Paying Living Expenses: Not hard at all  Food Insecurity: No Food Insecurity (04/05/2022)   Hunger Vital Sign    Worried About Running Out of Food in the Last Year: Never true    Ran Out of Food in the Last Year: Never true  Transportation Needs: No Transportation Needs (04/05/2022)   PRAPARE - Administrator, Civil Service (Medical): No    Lack of Transportation (Non-Medical): No  Physical Activity: Insufficiently Active (04/05/2022)   Exercise Vital Sign    Days of Exercise per Week: 3 days    Minutes of Exercise per Session: 30 min  Stress: No Stress  Concern Present (04/05/2022)   [employer] of Occupational Health - Occupational Stress Questionnaire    Feeling of Stress : Only a little  Social Connections: Socially Integrated (04/05/2022)   Social Connection and Isolation Panel [NHANES]    Frequency of Communication with Friends and Family: More than three times a week    Frequency of Social Gatherings with Friends and Family: Three times a week    Attends Religious Services: More than 4 times per year    Active Member of Clubs or Organizations:  Yes    Attends Club or Organization Meetings: More than 4 times per year    Marital Status: Married  Catering manager Violence: Not At Risk (04/05/2022)   Humiliation, Afraid, Rape, and Kick questionnaire    Fear of Current or Ex-Partner: No    Emotionally Abused: No    Physically Abused: No    Sexually Abused: No   Family History  Problem Relation Age of Onset   Hypertension Mother    Heart attack Mother    Stroke Father    Colon polyps Neg Hx    Colon cancer Neg Hx    Esophageal cancer Neg Hx    Rectal cancer Neg Hx    Stomach cancer Neg Hx      Review of Systems  All other systems reviewed and are negative.      Objective:   Physical Exam Vitals reviewed.  Constitutional:      General: He is not in acute distress.    Appearance: Normal appearance. He is well-developed and normal weight. He is not ill-appearing, toxic-appearing or diaphoretic.  HENT:     Head: Normocephalic and atraumatic.  Eyes:     Extraocular Movements: Extraocular movements intact.     Pupils: Pupils are equal, round, and reactive to light.  Neck:     Vascular: No carotid bruit.  Cardiovascular:     Rate and Rhythm: Normal rate and regular rhythm.     Heart sounds: Normal heart sounds. No murmur heard.    No friction rub. No gallop.  Pulmonary:     Effort: Pulmonary effort is normal. No respiratory distress.     Breath sounds: Normal breath sounds. No stridor. No wheezing, rhonchi or  rales.  Chest:     Chest wall: No tenderness.  Skin:    Coloration: Skin is not jaundiced.     Findings: No bruising, erythema, lesion or rash.  Neurological:     General: No focal deficit present.     Mental Status: He is alert and oriented to person, place, and time. Mental status is at baseline.     Cranial Nerves: No cranial nerve deficit.     Sensory: No sensory deficit.     Motor: No weakness or abnormal muscle tone.     Coordination: Coordination normal.     Gait: Gait normal.     Deep Tendon Reflexes: Reflexes are normal and symmetric.           Assessment & Plan:  Benign essential HTN - Plan: CBC with Differential/Platelet, Lipid panel, COMPLETE METABOLIC PANEL WITH GFR  Carotid stenosis, right  Aneurysm of descending thoracic aorta without rupture (HCC)  RLS (restless legs syndrome)  Vasculogenic erectile dysfunction, unspecified vasculogenic erectile dysfunction type I am very happy with his blood pressure.  I will check a CBC a CMP and a lipid panel.  I like to keep his LDL cholesterol less than 70 given his history of carotid artery stenosis.  I will defer to cardiology at [facility] to monitor his thoracic aortic aneurysm.  Patient states he is following up with vascular surgery however I have no record of this and I do not see a recent carotid ultrasound.  Last carotid ultrasound was in April of last year.  I will repeat that to monitor the right internal carotid stenosis.  I will try treating his restless leg syndrome with Mirapex 0.25 mg before bedtime.  We will uptitrate if beneficial and necessary.  I will also start the patient on alprostadil  2.5 mcg intracavernosal injection as needed for erectile dysfunction.  Gradually uptitrate as long as the patient is not having side effects until he achieves meaningful erection or reaches 10 mcg.

## 2022-10-19 ENCOUNTER — Ambulatory Visit (HOSPITAL_COMMUNITY)
Admission: RE | Admit: 2022-10-19 | Discharge: 2022-10-19 | Disposition: A | Discharge status: Home / Self Care | Payer: Medicare PPO | Source: Ambulatory Visit | Attending: Family Medicine | Admitting: Family Medicine

## 2022-10-19 DIAGNOSIS — I6521 Occlusion and stenosis of right carotid artery: Secondary | ICD-10-CM

## 2022-10-31 ENCOUNTER — Telehealth: Payer: Self-pay

## 2022-10-31 NOTE — Telephone Encounter (Signed)
Pt called and states that the Edex is over $200.00. Pt asks if there is a cheaper alternative? Thank you.

## 2022-11-01 ENCOUNTER — Other Ambulatory Visit: Payer: Self-pay

## 2022-11-01 DIAGNOSIS — N529 Male erectile dysfunction, unspecified: Secondary | ICD-10-CM

## 2022-11-03 ENCOUNTER — Other Ambulatory Visit: Payer: Self-pay | Admitting: Family Medicine

## 2022-11-05 NOTE — Telephone Encounter (Signed)
Requested Prescriptions  Pending Prescriptions Disp Refills   pantoprazole (PROTONIX) 40 MG tablet [Pharmacy Med Name: Pantoprazole Sodium 40 MG Oral Tablet Delayed Release] 90 tablet 0    Sig: Take 1 tablet by mouth once daily     Gastroenterology: Proton Pump Inhibitors Failed - 11/03/2022  5:36 AM      Failed - Valid encounter within last 12 months    Recent Outpatient Visits           1 year ago Bilateral carotid artery stenosis   [facility] Donita Brooks, MD   1 year ago Rib pain on left side   [facility] Pickard, Priscille Heidelberg, MD   1 year ago Encounter for Medicare annual wellness exam   [facility] Donita Brooks, MD   1 year ago Myalgia   [facility] Tanya Nones, Priscille Heidelberg, MD   1 year ago Myalgia   [facility] Pickard, Priscille Heidelberg, MD       Future Appointments             In 1 week Mallipeddi, Orion Modest, MD [facility] [place], [place]   In 4 weeks Marcine Matar, MD [facility] [place]

## 2022-11-14 ENCOUNTER — Ambulatory Visit: Payer: Medicare PPO | Admitting: Internal Medicine

## 2022-11-29 ENCOUNTER — Other Ambulatory Visit: Payer: Self-pay | Admitting: Family Medicine

## 2022-11-29 DIAGNOSIS — R079 Chest pain, unspecified: Secondary | ICD-10-CM

## 2022-11-30 NOTE — Telephone Encounter (Signed)
Last OV 10/04/22, within protocol.  Requested Prescriptions  Pending Prescriptions Disp Refills   amLODipine (NORVASC) 10 MG tablet [Pharmacy Med Name: amLODIPine Besylate 10 MG Oral Tablet] 90 tablet 0    Sig: Take 1 tablet by mouth once daily     Cardiovascular: Calcium Channel Blockers 2 Failed - 11/29/2022  5:37 AM      Failed - Valid encounter within last 6 months    Recent Outpatient Visits           1 year ago Bilateral carotid artery stenosis   [facility] Donita Brooks, MD   1 year ago Rib pain on left side   [facility] Pickard, Priscille Heidelberg, MD   1 year ago Encounter for Medicare annual wellness exam   [facility] Pickard, Priscille Heidelberg, MD   1 year ago Myalgia   [facility] Donita Brooks, MD   2 years ago Myalgia   [facility] Pickard, Priscille Heidelberg, MD       Future Appointments             In 4 days Marcine Matar, MD [facility] [place]            Passed - Last BP in normal range    BP Readings from Last 1 Encounters:  10/04/22 124/76         Passed - Last Heart Rate in normal range    Pulse Readings from Last 1 Encounters:  10/04/22 67

## 2022-12-03 NOTE — Progress Notes (Signed)
H&P  Chief Complaint: Erectile dysfunction  History of Present Illness: 72 year old male presents at this time, referred by Dr. Gilmore Laroche, for management of erectile dysfunction.  Has had ED for some time.  He does have improvement with Cialis and Viagra, but he does have significant headaches from this.  He has been given a prescription for Edex.  Apparently the dose is 20 mcg.  He has not used these yet.  His brother has instructed him on how to mix this.  This is cost prohibitive, apparently.  He is not on any nitrates.  Past Medical History:  Diagnosis Date   Arthritis    Epistaxis    GERD (gastroesophageal reflux disease)    Hematochezia    Hypertension    Microscopic hematuria    Neuromuscular disorder (HCC)    neuropathy   Stenosis of right carotid artery greater than 50%    Thyroid nodule    repeat US in 1 year (8/24)    Past Surgical History:  Procedure Laterality Date   COLONOSCOPY     SHOULDER SURGERY      Home Medications:  Allergies as of 12/04/2022   No Known Allergies      Medication List        Accurate as of December 03, 2022 12:27 PM. If you have any questions, ask your nurse or doctor.          amLODipine 10 MG tablet Commonly known as: NORVASC Take 1 tablet by mouth once daily   aspirin EC 81 MG tablet Take by mouth.   carvedilol 6.25 MG tablet Commonly known as: COREG Take 6.25 mg by mouth 2 (two) times daily with a meal.   doxazosin 4 MG tablet Commonly known as: CARDURA TAKE 1 TABLET BY MOUTH ONCE DAILY *STOP CLONIDINE*   Edex 10 MCG injection Generic drug: alprostadil 2.5 mcg by Intracavitary route as needed for erectile dysfunction. use no more than 3 times per week   losartan 50 MG tablet Commonly known as: COZAAR Take 50 mg by mouth daily.   meclizine 25 MG tablet Commonly known as: ANTIVERT Take 1 tablet (25 mg total) by mouth 3 (three) times daily as needed for dizziness.   multivitamin capsule Take 1 capsule by  mouth daily.   pantoprazole 40 MG tablet Commonly known as: PROTONIX Take 1 tablet by mouth once daily   pramipexole 0.125 MG tablet Commonly known as: Mirapex Take 2 tablets (0.25 mg total) by mouth at bedtime.   rosuvastatin 20 MG tablet Commonly known as: CRESTOR Take 1/2 (one-half) tablet by mouth once daily        Allergies: No Known Allergies  Family History  Problem Relation Age of Onset   Hypertension Mother    Heart attack Mother    Stroke Father    Colon polyps Neg Hx    Colon cancer Neg Hx    Esophageal cancer Neg Hx    Rectal cancer Neg Hx    Stomach cancer Neg Hx     Social History:  reports that he has never smoked. He has never used smokeless tobacco. He reports that he does not drink alcohol and does not use drugs.  ROS: A complete review of systems was performed.  All systems are negative except for pertinent findings as noted.  Physical Exam:  Vital signs in last 24 hours: There were no vitals taken for this visit. Constitutional:  Alert and oriented, No acute distress Cardiovascular: Regular rate  Respiratory: Normal respiratory effort  GI: No inguinal hernias Genitourinary: Phallus uncircumcised, without lesions.  Testicles normal bilaterally. Lymphatic: No lymphadenopathy Neurologic: Grossly intact, no focal deficits Psychiatric: Normal mood and affect   I have reviewed notes from referring/previous physicians  I have reviewed urinalysis results  I have reviewed prior PSA results-last done in early 2022, 1.05 IPSS  I have reviewed sheet   Impression/Assessment:  1.  Erectile dysfunction.  Intolerant of PDE 5 inhibitors.  He does have a prescription (quite expensive) for Edex that he has not used yet  2.  Screening for prostate cancer, normal PSA when last checked 2-1/2 years ago  Plan:  1.  I will have him come back to my office to show him at how to inject the ED  2.  If this works, I will provide a prescription that he can get  from a compound pharmacy in [place] that will be less expensive  3.  I would recommend continuing PSA screening until the age of 87

## 2022-12-04 ENCOUNTER — Encounter: Payer: Self-pay | Admitting: Urology

## 2022-12-04 ENCOUNTER — Ambulatory Visit: Payer: Medicare PPO | Admitting: Urology

## 2022-12-04 VITALS — BP 165/92 | HR 62

## 2022-12-04 DIAGNOSIS — Z125 Encounter for screening for malignant neoplasm of prostate: Secondary | ICD-10-CM

## 2022-12-04 DIAGNOSIS — N5201 Erectile dysfunction due to arterial insufficiency: Secondary | ICD-10-CM

## 2022-12-04 LAB — URINALYSIS, ROUTINE W REFLEX MICROSCOPIC
Bilirubin, UA: NEGATIVE
Glucose, UA: NEGATIVE
Ketones, UA: NEGATIVE
Leukocytes,UA: NEGATIVE
Nitrite, UA: NEGATIVE
Protein,UA: NEGATIVE
RBC, UA: NEGATIVE
Specific Gravity, UA: 1.03 (ref 1.005–1.030)
Urobilinogen, Ur: 0.2 mg/dL (ref 0.2–1.0)
pH, UA: 6 (ref 5.0–7.5)

## 2022-12-07 DIAGNOSIS — M48061 Spinal stenosis, lumbar region without neurogenic claudication: Secondary | ICD-10-CM | POA: Diagnosis not present

## 2022-12-24 DIAGNOSIS — I77810 Thoracic aortic ectasia: Secondary | ICD-10-CM | POA: Diagnosis not present

## 2022-12-24 DIAGNOSIS — I10 Essential (primary) hypertension: Secondary | ICD-10-CM | POA: Diagnosis not present

## 2022-12-28 DIAGNOSIS — K760 Fatty (change of) liver, not elsewhere classified: Secondary | ICD-10-CM | POA: Diagnosis not present

## 2022-12-28 DIAGNOSIS — I10 Essential (primary) hypertension: Secondary | ICD-10-CM | POA: Diagnosis not present

## 2022-12-28 DIAGNOSIS — R0602 Shortness of breath: Secondary | ICD-10-CM | POA: Diagnosis not present

## 2022-12-28 DIAGNOSIS — I77810 Thoracic aortic ectasia: Secondary | ICD-10-CM | POA: Diagnosis not present

## 2022-12-28 DIAGNOSIS — E079 Disorder of thyroid, unspecified: Secondary | ICD-10-CM | POA: Diagnosis not present

## 2023-01-04 ENCOUNTER — Ambulatory Visit (INDEPENDENT_AMBULATORY_CARE_PROVIDER_SITE_OTHER): Payer: Medicare PPO | Admitting: Family Medicine

## 2023-01-04 VITALS — BP 136/78 | HR 72 | Temp 98.5°F | Ht 68.0 in | Wt 223.0 lb

## 2023-01-04 DIAGNOSIS — K760 Fatty (change of) liver, not elsewhere classified: Secondary | ICD-10-CM | POA: Diagnosis not present

## 2023-01-04 NOTE — Progress Notes (Signed)
Subjective:    Patient ID: Stephen Butler, male    DOB: 1951-04-13, 72 y.o.   MRN: [id number]  HPI  Patient recently had a CT scan at his cardiologist office.  This is in another health system.  However apparently there was evidence of severe hepatic steatosis.  There is also evidence of "abnormal thyroid tissue".  I am unable to review the images with official report of the CT scan.  However his cardiologist to schedule the patient for thyroid ultrasound next week.  I am assuming that he has a thyroid nodule.  Patient denies any history of alcoholism.  He denies any history of exposure to hepatitis B or C.  He has no family history of Wilson's disease or hemochromatosis.  He has no family history of autoimmune hepatitis.  He does admit to eating a diet high in saturated fat including a lot of fast food and fried foods. Past Medical History:  Diagnosis Date   Arthritis    Epistaxis    GERD (gastroesophageal reflux disease)    Hematochezia    Hypertension    Microscopic hematuria    Neuromuscular disorder (HCC)    neuropathy   Stenosis of right carotid artery greater than 50%    Thyroid nodule    repeat US in 1 year (8/24)   Past Surgical History:  Procedure Laterality Date   COLONOSCOPY     SHOULDER SURGERY     Current Outpatient Medications on File Prior to Visit  Medication Sig Dispense Refill   alprostadil (EDEX) 10 MCG injection 2.5 mcg by Intracavitary route as needed for erectile dysfunction. use no more than 3 times per week 1 each 12   amLODipine (NORVASC) 10 MG tablet Take 1 tablet by mouth once daily 90 tablet 0   aspirin EC 81 MG tablet Take by mouth.     carvedilol (COREG) 6.25 MG tablet Take 12.5 mg by mouth daily.     doxazosin (CARDURA) 4 MG tablet TAKE 1 TABLET BY MOUTH ONCE DAILY *STOP CLONIDINE* 30 tablet 0   losartan (COZAAR) 50 MG tablet Take 50 mg by mouth daily.     meclizine (ANTIVERT) 25 MG tablet Take 1 tablet (25 mg total) by mouth 3 (three) times daily  as needed for dizziness. 30 tablet 0   Multiple Vitamin (MULTIVITAMIN) capsule Take 1 capsule by mouth daily.     pantoprazole (PROTONIX) 40 MG tablet Take 1 tablet by mouth once daily 90 tablet 0   pramipexole (MIRAPEX) 0.125 MG tablet Take 2 tablets (0.25 mg total) by mouth at bedtime. 60 tablet 1   rosuvastatin (CRESTOR) 20 MG tablet Take 1/2 (one-half) tablet by mouth once daily 45 tablet 1   No current facility-administered medications on file prior to visit.      No Known Allergies Social History   Socioeconomic History   Marital status: Married    Spouse name: Not on file   Number of children: 0   Years of education: Not on file   Highest education level: Not on file  Occupational History   Occupation: retired  Tobacco Use   Smoking status: Never   Smokeless tobacco: Never  Vaping Use   Vaping status: Never Used  Substance and Sexual Activity   Alcohol use: No   Drug use: No   Sexual activity: Yes    Partners: Female  Other Topics Concern   Not on file  Social History Narrative   Not on file   Social Determinants of Health  Financial Resource Strain: Low Risk  (04/05/2022)   Overall Financial Resource Strain (CARDIA)    Difficulty of Paying Living Expenses: Not hard at all  Food Insecurity: Low Risk  (12/24/2022)   Received from [facility]   Hunger Vital Sign    Worried About Running Out of Food in the Last Year: Never true    Ran Out of Food in the Last Year: Never true  Transportation Needs: No Transportation Needs (12/24/2022)   Received from [facility]    In the past 12 months, has lack of reliable transportation kept you from medical appointments, meetings, work or from getting things needed for daily living? : No  Physical Activity: Insufficiently Active (04/05/2022)   Exercise Vital Sign    Days of Exercise per Week: 3 days    Minutes of Exercise per Session: 30 min  Stress: No Stress Concern Present (04/05/2022)   [employer] of Occupational Health - Occupational Stress Questionnaire    Feeling of Stress : Only a little  Social Connections: Socially Integrated (04/05/2022)   Social Connection and Isolation Panel [NHANES]    Frequency of Communication with Friends and Family: More than three times a week    Frequency of Social Gatherings with Friends and Family: Three times a week    Attends Religious Services: More than 4 times per year    Active Member of Clubs or Organizations: Yes    Attends Banker Meetings: More than 4 times per year    Marital Status: Married  Catering manager Violence: Not At Risk (04/05/2022)   Humiliation, Afraid, Rape, and Kick questionnaire    Fear of Current or Ex-Partner: No    Emotionally Abused: No    Physically Abused: No    Sexually Abused: No   Family History  Problem Relation Age of Onset   Hypertension Mother    Heart attack Mother    Stroke Father    Colon polyps Neg Hx    Colon cancer Neg Hx    Esophageal cancer Neg Hx    Rectal cancer Neg Hx    Stomach cancer Neg Hx      Review of Systems  All other systems reviewed and are negative.      Objective:   Physical Exam Vitals reviewed.  Constitutional:      General: He is not in acute distress.    Appearance: Normal appearance. He is well-developed and normal weight. He is not ill-appearing, toxic-appearing or diaphoretic.  HENT:     Head: Normocephalic and atraumatic.  Eyes:     Extraocular Movements: Extraocular movements intact.     Pupils: Pupils are equal, round, and reactive to light.  Neck:     Vascular: No carotid bruit.  Cardiovascular:     Rate and Rhythm: Normal rate and regular rhythm.     Heart sounds: Normal heart sounds. No murmur heard.    No friction rub. No gallop.  Pulmonary:     Effort: Pulmonary effort is normal. No respiratory distress.     Breath sounds: Normal breath sounds. No stridor. No wheezing, rhonchi or rales.  Chest:     Chest wall: No  tenderness.  Skin:    Coloration: Skin is not jaundiced.     Findings: No bruising, erythema, lesion or rash.  Neurological:     General: No focal deficit present.     Mental Status: He is alert and oriented to person, place, and time. Mental status is at  baseline.     Cranial Nerves: No cranial nerve deficit.     Sensory: No sensory deficit.     Motor: No weakness or abnormal muscle tone.     Coordination: Coordination normal.     Gait: Gait normal.     Deep Tendon Reflexes: Reflexes are normal and symmetric.           Assessment & Plan:  Hepatic steatosis - Plan: Hepatitis C antibody, Hepatitis B surface antibody,quantitative, ANA, Ferritin I believe that the patient's hepatic steatosis is likely due to fatty liver disease.  I will obtain serologies to rule out hepatitis B and hepatitis C.  I will check an ANA to rule out autoimmune hepatitis.  I will check a ferritin level to evaluate for hemochromatosis.  Given that there is no family history of Wilson's disease I do not feel that we need to check a ceruloplasmin level.  If lab work is normal, I suspect that his hepatic steatosis is due to fatty liver disease and we discussed extensive dietary and lifestyle changes to help address this including a diet rich in fruits and vegetables low in saturated fat.  I recommended eating more chicken and fish and avoiding fried food, pork, and beef.  Also recommended exercise and weight loss.  I did review his most recent lab work which showed no active inflammation in his liver due to normal liver function test

## 2023-01-06 LAB — ANTI-NUCLEAR AB-TITER (ANA TITER): ANA Titer 1: 1:40 {titer} — ABNORMAL HIGH

## 2023-01-06 LAB — HEPATITIS C ANTIBODY: Hepatitis C Ab: NONREACTIVE

## 2023-01-06 LAB — HEPATITIS B SURFACE ANTIBODY, QUANTITATIVE: Hep B S AB Quant (Post): <5 m[IU]/mL — ABNORMAL LOW (ref >=10)

## 2023-01-06 LAB — FERRITIN: Ferritin: 67 ng/mL (ref 24–380)

## 2023-01-06 LAB — ANA: Anti Nuclear Antibody (ANA): POSITIVE — AB

## 2023-01-07 ENCOUNTER — Ambulatory Visit: Payer: Medicare PPO

## 2023-01-10 DIAGNOSIS — E041 Nontoxic single thyroid nodule: Secondary | ICD-10-CM | POA: Diagnosis not present

## 2023-01-14 ENCOUNTER — Other Ambulatory Visit: Payer: Self-pay | Admitting: Family Medicine

## 2023-01-15 DIAGNOSIS — I6523 Occlusion and stenosis of bilateral carotid arteries: Secondary | ICD-10-CM | POA: Diagnosis not present

## 2023-01-15 DIAGNOSIS — I10 Essential (primary) hypertension: Secondary | ICD-10-CM | POA: Diagnosis not present

## 2023-01-15 DIAGNOSIS — E785 Hyperlipidemia, unspecified: Secondary | ICD-10-CM | POA: Diagnosis not present

## 2023-01-15 DIAGNOSIS — I2510 Atherosclerotic heart disease of native coronary artery without angina pectoris: Secondary | ICD-10-CM | POA: Diagnosis not present

## 2023-01-15 NOTE — Telephone Encounter (Signed)
Requested by interface surescripts. Last OV 01/04/23.  Requested Prescriptions  Pending Prescriptions Disp Refills   rosuvastatin (CRESTOR) 20 MG tablet [Pharmacy Med Name: Rosuvastatin Calcium 20 MG Oral Tablet] 45 tablet 0    Sig: Take 1/2 (one-half) tablet by mouth once daily     Cardiovascular:  Antilipid - Statins 2 Failed - 01/15/2023 12:33 PM      Failed - Valid encounter within last 12 months    Recent Outpatient Visits           1 year ago Bilateral carotid artery stenosis   [facility] Donita Brooks, MD   1 year ago Rib pain on left side   [facility] Pickard, Priscille Heidelberg, MD   2 years ago Encounter for Medicare annual wellness exam   [facility] Pickard, Priscille Heidelberg, MD   2 years ago Myalgia   [facility] Tanya Nones, Priscille Heidelberg, MD   2 years ago Myalgia   [facility] Donita Brooks, MD              Failed - Lipid Panel in normal range within the last 12 months    Cholesterol  Date Value Ref Range Status  10/04/2022 136 <200 mg/dL Final   LDL Cholesterol (Calc)  Date Value Ref Range Status  10/04/2022 60 mg/dL (calc) Final    Comment:    Reference range: <100 . Desirable range <100 mg/dL for primary prevention;   <70 mg/dL for patients with CHD or diabetic patients  with > or = 2 CHD risk factors. Marland Kitchen LDL-C is now calculated using the Martin-Hopkins  calculation, which is a validated novel method providing  better accuracy than the Friedewald equation in the  estimation of LDL-C.  Horald Pollen et al. Lenox Ahr. 2841;324(40): 2061-2068  (http://education.[facility].com/faq/FAQ164)    Direct LDL  Date Value Ref Range Status  07/06/2019 63 <100 mg/dL Final    Comment:    Greatly elevated Triglycerides values (>1200 mg/dL) interfere with the dLDL assay. As no Triglycerides  testing was ordered, interpret results with caution. . Desirable range <100 mg/dL for primary prevention;    <70 mg/dL for patients with CHD or diabetic patients  with > or = 2 CHD risk factors. Marland Kitchen    HDL  Date Value Ref Range Status  10/04/2022 55 > OR = 40 mg/dL Final   Triglycerides  Date Value Ref Range Status  10/04/2022 133 <150 mg/dL Final         Passed - Cr in normal range and within 360 days    Creat  Date Value Ref Range Status  10/04/2022 1.08 0.70 - 1.28 mg/dL Final         Passed - Patient is not pregnant

## 2023-01-22 DIAGNOSIS — I6523 Occlusion and stenosis of bilateral carotid arteries: Secondary | ICD-10-CM | POA: Diagnosis not present

## 2023-02-01 ENCOUNTER — Other Ambulatory Visit: Payer: Self-pay | Admitting: Family Medicine

## 2023-02-01 NOTE — Telephone Encounter (Signed)
Requested Prescriptions  Pending Prescriptions Disp Refills   pantoprazole (PROTONIX) 40 MG tablet [Pharmacy Med Name: Pantoprazole Sodium 40 MG Oral Tablet Delayed Release] 90 tablet 0    Sig: Take 1 tablet by mouth once daily     Gastroenterology: Proton Pump Inhibitors Failed - 02/01/2023  5:36 AM      Failed - Valid encounter within last 12 months    Recent Outpatient Visits           1 year ago Bilateral carotid artery stenosis   [facility] Donita Brooks, MD   1 year ago Rib pain on left side   [facility] Pickard, Priscille Heidelberg, MD   2 years ago Encounter for Medicare annual wellness exam   [facility] Donita Brooks, MD   2 years ago Myalgia   [facility] Donita Brooks, MD   2 years ago Myalgia   [facility] Pickard, Priscille Heidelberg, MD

## 2023-02-27 ENCOUNTER — Other Ambulatory Visit: Payer: Self-pay | Admitting: Family Medicine

## 2023-02-27 DIAGNOSIS — R079 Chest pain, unspecified: Secondary | ICD-10-CM

## 2023-03-26 DIAGNOSIS — R079 Chest pain, unspecified: Secondary | ICD-10-CM | POA: Diagnosis not present

## 2023-03-26 DIAGNOSIS — W19XXXA Unspecified fall, initial encounter: Secondary | ICD-10-CM | POA: Diagnosis not present

## 2023-03-27 DIAGNOSIS — G5603 Carpal tunnel syndrome, bilateral upper limbs: Secondary | ICD-10-CM | POA: Diagnosis not present

## 2023-04-29 NOTE — Progress Notes (Signed)
 History of Present Illness: [name] returns for f/u of ED. Initial visit in August '24 was to have been followed by a visit to teach IC injections.  Apparently he has been using Edex  which he obtained from his PCP.  He cannot tell me how much he is injecting, but he states that he draws up 1-Click.  He has not been getting good erections from this injection.  He has never been taught proper injection technique.  Past Medical History:  Diagnosis Date   Arthritis    Epistaxis    GERD (gastroesophageal reflux disease)    Hematochezia    Hypertension    Microscopic hematuria    Neuromuscular disorder (HCC)    neuropathy   Stenosis of right carotid artery greater than 50%    Thyroid  nodule    repeat us  in 1 year (8/24)    Past Surgical History:  Procedure Laterality Date   COLONOSCOPY     SHOULDER SURGERY      Home Medications:  Allergies as of 04/30/2023   No Known Allergies      Medication List        Accurate as of April 29, 2023 10:44 AM. If you have any questions, ask your nurse or doctor.          amLODipine  10 MG tablet Commonly known as: NORVASC  Take 1 tablet by mouth once daily   aspirin  EC 81 MG tablet Take by mouth.   carvedilol 6.25 MG tablet Commonly known as: COREG Take 12.5 mg by mouth daily.   doxazosin  4 MG tablet Commonly known as: CARDURA  TAKE 1 TABLET BY MOUTH ONCE DAILY *STOP CLONIDINE *   Edex  10 MCG injection Generic drug: alprostadil  2.5 mcg by Intracavitary route as needed for erectile dysfunction. use no more than 3 times per week   losartan  50 MG tablet Commonly known as: COZAAR  Take 50 mg by mouth daily.   meclizine  25 MG tablet Commonly known as: ANTIVERT  Take 1 tablet (25 mg total) by mouth 3 (three) times daily as needed for dizziness.   multivitamin capsule Take 1 capsule by mouth daily.   pantoprazole  40 MG tablet Commonly known as: PROTONIX  Take 1 tablet by mouth once daily   pramipexole  0.125 MG  tablet Commonly known as: Mirapex  Take 2 tablets (0.25 mg total) by mouth at bedtime.   rosuvastatin  20 MG tablet Commonly known as: CRESTOR  Take 1/2 (one-half) tablet by mouth once daily        Allergies: No Known Allergies  Family History  Problem Relation Age of Onset   Hypertension Mother    Heart attack Mother    Stroke Father    Colon polyps Neg Hx    Colon cancer Neg Hx    Esophageal cancer Neg Hx    Rectal cancer Neg Hx    Stomach cancer Neg Hx     Social History:  reports that he has never smoked. He has never used smokeless tobacco. He reports that he does not drink alcohol and does not use drugs.  ROS: A complete review of systems was performed.  All systems are negative except for pertinent findings as noted.  Physical Exam:  Vital signs in last 24 hours: There were no vitals taken for this visit. Constitutional:  Alert and oriented, No acute distress Cardiovascular: Regular rate  Respiratory: Normal respiratory effort  Neurologic: Grossly intact, no focal deficits Psychiatric: Normal mood and affect  I have reviewed prior pt notes  I have reviewed urinalysis results  Impression/Assessment:  ED, on injections although I do not know the dose that he is getting.  He is not getting adequate erections.  Plan:  I spent a long time discussing the plan with this gentleman.  I hope he understands.  He will bring in his index and show the nurse the approximate amount he injects.  I will have the nurse convey to me his dose.  Following that, I will decide on the dose that [facility] prostaglandin E1 injections.  Also, once we [facility], we will have him come in for me to make sure he is injecting adequately.

## 2023-04-30 ENCOUNTER — Other Ambulatory Visit: Payer: Self-pay | Admitting: Family Medicine

## 2023-04-30 ENCOUNTER — Encounter: Payer: Self-pay | Admitting: Urology

## 2023-04-30 ENCOUNTER — Ambulatory Visit: Payer: Medicare PPO | Admitting: Urology

## 2023-04-30 VITALS — BP 185/91 | HR 65

## 2023-04-30 DIAGNOSIS — N5201 Erectile dysfunction due to arterial insufficiency: Secondary | ICD-10-CM

## 2023-05-01 NOTE — Telephone Encounter (Signed)
 Requested by interface surescripts. No future visit last OV 9/24 Requested Prescriptions  Pending Prescriptions Disp Refills   pantoprazole  (PROTONIX ) 40 MG tablet [Pharmacy Med Name: Pantoprazole  Sodium 40 MG Oral Tablet Delayed Release] 90 tablet 0    Sig: Take 1 tablet by mouth once daily     Gastroenterology: Proton Pump Inhibitors Failed - 05/01/2023  8:47 AM      Failed - Valid encounter within last 12 months    Recent Outpatient Visits           1 year ago Bilateral carotid artery stenosis   [facility] Austine Lefort, MD   1 year ago Rib pain on left side   [facility] Pickard, Cisco Crest, MD   2 years ago Encounter for Medicare annual wellness exam   [facility] Austine Lefort, MD   2 years ago Myalgia   [facility] Austine Lefort, MD   2 years ago Myalgia   [facility] Pickard, Cisco Crest, MD

## 2023-05-02 ENCOUNTER — Telehealth: Payer: Self-pay

## 2023-05-02 NOTE — Telephone Encounter (Signed)
Patient came in today and brought edex medication. Patient inject 0.50 ml PRN. Patient is aware a message will be sent to Dr. Retta Diones on advisement on edex Rx. Patient voiced understanding.

## 2023-05-03 ENCOUNTER — Ambulatory Visit: Payer: Medicare PPO | Admitting: Family Medicine

## 2023-05-03 ENCOUNTER — Encounter: Payer: Self-pay | Admitting: Family Medicine

## 2023-05-03 VITALS — BP 120/80 | HR 61 | Temp 98.5°F | Ht 68.0 in | Wt 231.5 lb

## 2023-05-03 DIAGNOSIS — Z23 Encounter for immunization: Secondary | ICD-10-CM | POA: Diagnosis not present

## 2023-05-03 DIAGNOSIS — M62830 Muscle spasm of back: Secondary | ICD-10-CM | POA: Insufficient documentation

## 2023-05-03 MED ORDER — CYCLOBENZAPRINE HCL 10 MG PO TABS: 10.0000 mg | ORAL_TABLET | Freq: Every day | ORAL | 0 refills | Status: AC

## 2023-05-03 MED ORDER — METHOCARBAMOL 500 MG PO TABS: 500.0000 mg | ORAL_TABLET | Freq: Three times a day (TID) | ORAL | 0 refills | Status: DC

## 2023-05-03 NOTE — Progress Notes (Signed)
Patient Office Visit  Assessment & Plan:  Back muscle spasm -     Methocarbamol; Take 1 tablet (500 mg total) by mouth 3 (three) times daily. During the day  Dispense: 30 tablet; Refill: 0 -     Cyclobenzaprine HCl; Take 1 tablet (10 mg total) by mouth at bedtime. Take at night time  Dispense: 30 tablet; Refill: 0  Needs flu shot -     Flu Vaccine Trivalent High Dose (Fluad)   Patient will use the Robaxin during the day and the Flexeril at nighttime.  Stretching exercises recommended.  No improvement or worsening symptoms he is to notify us.  High-dose flu shot given today. No follow-ups on file.   Subjective:    Patient ID: Kalix Schey, male    DOB: 03-22-1951  Age: 73 y.o. MRN: [id number]  Chief Complaint  Patient presents with   Back Pain    Midline back pain x 1 week. Pt states it feels like a muscle spasm.     Back Pain    Mid back spasm- started one week ago.  Patient not sure what he did but has not gone away.  Patient did have some Zanaflex at home which she took but did not take anything at nighttime.  Patient had Mobic at home but did not take it.  Patient felt more of a muscle tightness rather than inflammation.  Pt went bowling yesterday which may have exacerbated his symptoms.  Patient did feel that the spasms and muscle tightness were worse afterwards.  Tizanidine did help a litle bit. Rates pain level 6-7/10. Pt slept Ok last night.  Patient does not have a history of kidney stones.  Patient denies trauma.  Patient does not have a rash. Pt will get the flu shot today.  The 10-year ASCVD risk score (Arnett DK, et al., 2019) is: 14.9%  Past Medical History:  Diagnosis Date   Arthritis    Epistaxis    GERD (gastroesophageal reflux disease)    Hematochezia    Hypertension    Microscopic hematuria    Neuromuscular disorder (HCC)    neuropathy   Stenosis of right carotid artery greater than 50%    Thyroid nodule    repeat US in 1 year (8/24)   Past  Surgical History:  Procedure Laterality Date   COLONOSCOPY     SHOULDER SURGERY     Social History   Tobacco Use   Smoking status: Never   Smokeless tobacco: Never  Vaping Use   Vaping status: Never Used  Substance Use Topics   Alcohol use: No   Drug use: No   Social History   Socioeconomic History   Marital status: Married    Spouse name: Not on file   Number of children: 0   Years of education: Not on file   Highest education level: Not on file  Occupational History   Occupation: retired  Tobacco Use   Smoking status: Never   Smokeless tobacco: Never  Vaping Use   Vaping status: Never Used  Substance and Sexual Activity   Alcohol use: No   Drug use: No   Sexual activity: Yes    Partners: Female  Other Topics Concern   Not on file  Social History Narrative   Retired    Social Drivers of Corporate investment banker Strain: Low Risk  (04/05/2022)   Overall Financial Resource Strain (CARDIA)    Difficulty of Paying Living Expenses: Not hard at all  Food  Insecurity: Low Risk  (03/27/2023)   Received from [facility]   Hunger Vital Sign    Worried About Running Out of Food in the Last Year: Never true    Ran Out of Food in the Last Year: Never true  Transportation Needs: No Transportation Needs (03/27/2023)   Received from [facility]    In the past 12 months, has lack of reliable transportation kept you from medical appointments, meetings, work or from getting things needed for daily living? : No  Physical Activity: Insufficiently Active (04/05/2022)   Exercise Vital Sign    Days of Exercise per Week: 3 days    Minutes of Exercise per Session: 30 min  Stress: No Stress Concern Present (04/05/2022)   [employer] of Occupational Health - Occupational Stress Questionnaire    Feeling of Stress : Only a little  Social Connections: Socially Integrated (04/05/2022)   Social Connection and Isolation Panel [NHANES]    Frequency of  Communication with Friends and Family: More than three times a week    Frequency of Social Gatherings with Friends and Family: Three times a week    Attends Religious Services: More than 4 times per year    Active Member of Clubs or Organizations: Yes    Attends Banker Meetings: More than 4 times per year    Marital Status: Married  Catering manager Violence: Not At Risk (04/05/2022)   Humiliation, Afraid, Rape, and Kick questionnaire    Fear of Current or Ex-Partner: No    Emotionally Abused: No    Physically Abused: No    Sexually Abused: No      Review of Systems  Musculoskeletal:  Positive for back pain.      Objective:    BP 120/80 (BP Location: Left Arm)   Pulse 61   Temp 98.5 F (36.9 C)   Ht 5\' 8"  (1.727 m)   Wt 231 lb 8 oz (105 kg)   SpO2 99%   BMI 35.20 kg/m  BP Readings from Last 3 Encounters:  05/03/23 120/80  04/30/23 (!) 185/91  01/04/23 136/78   Wt Readings from Last 3 Encounters:  05/03/23 231 lb 8 oz (105 kg)  01/04/23 223 lb (101.2 kg)  10/04/22 221 lb (100.2 kg)    Physical Exam Vitals and nursing note reviewed.  Constitutional:      Appearance: Normal appearance.     Comments: Patient is sitting in the chair comfortably and is able to stand without assistance.  HENT:     Head: Normocephalic and atraumatic.  Eyes:     Extraocular Movements: Extraocular movements intact.     Conjunctiva/sclera: Conjunctivae normal.     Pupils: Pupils are equal, round, and reactive to light.  Cardiovascular:     Rate and Rhythm: Normal rate and regular rhythm.     Heart sounds: Normal heart sounds.  Pulmonary:     Effort: Pulmonary effort is normal.     Breath sounds: Normal breath sounds.  Musculoskeletal:     Thoracic back: Spasms, tenderness and bony tenderness present. No scoliosis.     Lumbar back: Normal.     Right lower leg: No edema.     Left lower leg: No edema.     Comments: Patient is able to twist to the left and right sides  but has more discomfort when he turns to the right side.  Patient states feels muscle tightens up  Skin:    Findings: No rash.  Neurological:  General: No focal deficit present.     Mental Status: He is alert and oriented to person, place, and time.  Psychiatric:        Mood and Affect: Mood normal.        Behavior: Behavior normal.        Thought Content: Thought content normal.        Judgment: Judgment normal.      No results found for any visits on 05/03/23.

## 2023-05-06 NOTE — Telephone Encounter (Signed)
Per patient medication  2.5 mcg by Intracavitary route as needed for erectile dysfunction. use no more than 3 times per week.

## 2023-05-06 NOTE — Telephone Encounter (Signed)
Called patient to confirmed dosage. Patient states he will call back

## 2023-05-07 ENCOUNTER — Other Ambulatory Visit: Payer: Self-pay | Admitting: Family Medicine

## 2023-05-07 NOTE — Telephone Encounter (Signed)
Requested Prescriptions  Pending Prescriptions Disp Refills   rosuvastatin (CRESTOR) 20 MG tablet [Pharmacy Med Name: Rosuvastatin Calcium 20 MG Oral Tablet] 45 tablet 0    Sig: Take 1/2 (one-half) tablet by mouth once daily     Cardiovascular:  Antilipid - Statins 2 Failed - 05/07/2023  5:59 PM      Failed - Valid encounter within last 12 months    Recent Outpatient Visits           1 year ago Bilateral carotid artery stenosis   [facility] Donita Brooks, MD   1 year ago Rib pain on left side   [facility] Pickard, Priscille Heidelberg, MD   2 years ago Encounter for Medicare annual wellness exam   [facility] Pickard, Priscille Heidelberg, MD   2 years ago Myalgia   [facility] Tanya Nones, Priscille Heidelberg, MD   2 years ago Myalgia   [facility] Donita Brooks, MD              Failed - Lipid Panel in normal range within the last 12 months    Cholesterol  Date Value Ref Range Status  10/04/2022 136 <200 mg/dL Final   LDL Cholesterol (Calc)  Date Value Ref Range Status  10/04/2022 60 mg/dL (calc) Final    Comment:    Reference range: <100 . Desirable range <100 mg/dL for primary prevention;   <70 mg/dL for patients with CHD or diabetic patients  with > or = 2 CHD risk factors. Marland Kitchen LDL-C is now calculated using the Martin-Hopkins  calculation, which is a validated novel method providing  better accuracy than the Friedewald equation in the  estimation of LDL-C.  Horald Pollen et al. Lenox Ahr. 6045;409(81): 2061-2068  (http://education.[facility].com/faq/FAQ164)    Direct LDL  Date Value Ref Range Status  07/06/2019 63 <100 mg/dL Final    Comment:    Greatly elevated Triglycerides values (>1200 mg/dL) interfere with the dLDL assay. As no Triglycerides  testing was ordered, interpret results with caution. . Desirable range <100 mg/dL for primary prevention;   <70 mg/dL for patients with CHD or diabetic patients   with > or = 2 CHD risk factors. Marland Kitchen    HDL  Date Value Ref Range Status  10/04/2022 55 > OR = 40 mg/dL Final   Triglycerides  Date Value Ref Range Status  10/04/2022 133 <150 mg/dL Final         Passed - Cr in normal range and within 360 days    Creat  Date Value Ref Range Status  10/04/2022 1.08 0.70 - 1.28 mg/dL Final         Passed - Patient is not pregnant

## 2023-05-08 NOTE — Telephone Encounter (Signed)
Medication was not called in he has an appointment tomorrow.  Please advise

## 2023-05-08 NOTE — Telephone Encounter (Signed)
Is this the Rx he wants to get refilled ? Cause we didn't prescribe it !

## 2023-05-09 ENCOUNTER — Ambulatory Visit: Payer: Medicare PPO

## 2023-05-09 ENCOUNTER — Telehealth: Payer: Self-pay | Admitting: Urology

## 2023-05-09 DIAGNOSIS — H16223 Keratoconjunctivitis sicca, not specified as Sjogren's, bilateral: Secondary | ICD-10-CM | POA: Diagnosis not present

## 2023-05-09 DIAGNOSIS — H2513 Age-related nuclear cataract, bilateral: Secondary | ICD-10-CM | POA: Diagnosis not present

## 2023-05-09 DIAGNOSIS — H25013 Cortical age-related cataract, bilateral: Secondary | ICD-10-CM | POA: Diagnosis not present

## 2023-05-09 NOTE — Telephone Encounter (Signed)
Has an appt today and medication was not called in

## 2023-05-09 NOTE — Telephone Encounter (Signed)
Called Pt to verify what Rx he wanted refilled Pt let me know he believed MD Dahlstedt was sending in a Rx and that he had an appt today. I let Pt know we had no Rx sent in by MD and that he doesn't have a f/u appt scheduled for today. I let Pt know I would reach out to MD to confirm what Rx he was sending in if any and if he was possibly adjusting a Rx based off of previous telephone encounter.

## 2023-05-09 NOTE — Telephone Encounter (Signed)
Patient was just scheduled today just to show the amount of medication needed to be ordered. He came by 05/02/23 per previous encounter and message was sent to MD. Please let patient know appointment today is not needed and MD is aware of medication needing to be sent in. Please cancel today's appointment.Thanks.

## 2023-05-10 MED ORDER — AMBULATORY NON FORMULARY MEDICATION: 0 refills | Status: AC

## 2023-05-10 NOTE — Addendum Note (Signed)
Addended by: Sarajane Jews on: 05/10/2023 01:56 PM   Modules accepted: Orders

## 2023-05-14 ENCOUNTER — Ambulatory Visit (INDEPENDENT_AMBULATORY_CARE_PROVIDER_SITE_OTHER): Payer: Medicare PPO | Admitting: Urology

## 2023-05-14 ENCOUNTER — Telehealth: Payer: Self-pay

## 2023-05-14 DIAGNOSIS — N5201 Erectile dysfunction due to arterial insufficiency: Secondary | ICD-10-CM | POA: Diagnosis not present

## 2023-05-14 NOTE — Progress Notes (Signed)
History of Present Illness: Here for injection training.  The patient had apparently been on 2.5 mcg injections of Edex prior to his initial presentation here.  These were ineffective with treating his ED.  Currently he comes in with new dosing of 20 mcg/milliliter.  Past Medical History:  Diagnosis Date   Arthritis    Epistaxis    GERD (gastroesophageal reflux disease)    Hematochezia    Hypertension    Microscopic hematuria    Neuromuscular disorder (HCC)    neuropathy   Stenosis of right carotid artery greater than 50%    Thyroid nodule    repeat US in 1 year (8/24)    Past Surgical History:  Procedure Laterality Date   COLONOSCOPY     SHOULDER SURGERY      Home Medications:  Allergies as of 05/14/2023   No Known Allergies      Medication List        Accurate as of May 14, 2023  2:49 PM. If you have any questions, ask your nurse or doctor.          AMBULATORY NON FORMULARY MEDICATION Medication Name: Prostaglandin  E1 20 mcg/mL Inject 1/2-1 mL prn Disp 5 20 mcg vials  0 rf   amLODipine 10 MG tablet Commonly known as: NORVASC Take 1 tablet by mouth once daily   aspirin EC 81 MG tablet Take by mouth.   carvedilol 6.25 MG tablet Commonly known as: COREG Take 12.5 mg by mouth daily.   cyclobenzaprine 10 MG tablet Commonly known as: FLEXERIL Take 1 tablet (10 mg total) by mouth at bedtime. Take at night time   doxazosin 4 MG tablet Commonly known as: CARDURA TAKE 1 TABLET BY MOUTH ONCE DAILY *STOP CLONIDINE*   Edex 10 MCG injection Generic drug: alprostadil 2.5 mcg by Intracavitary route as needed for erectile dysfunction. use no more than 3 times per week   losartan 50 MG tablet Commonly known as: COZAAR Take 50 mg by mouth daily.   meclizine 25 MG tablet Commonly known as: ANTIVERT Take 1 tablet (25 mg total) by mouth 3 (three) times daily as needed for dizziness.   methocarbamol 500 MG tablet Commonly known as: ROBAXIN Take 1  tablet (500 mg total) by mouth 3 (three) times daily. During the day   multivitamin capsule Take 1 capsule by mouth daily.   pantoprazole 40 MG tablet Commonly known as: PROTONIX Take 1 tablet by mouth once daily   pramipexole 0.125 MG tablet Commonly known as: Mirapex Take 2 tablets (0.25 mg total) by mouth at bedtime.   rosuvastatin 20 MG tablet Commonly known as: CRESTOR Take 1/2 (one-half) tablet by mouth once daily        Allergies: No Known Allergies  Family History  Problem Relation Age of Onset   Hypertension Mother    Heart attack Mother    Stroke Father    Colon polyps Neg Hx    Colon cancer Neg Hx    Esophageal cancer Neg Hx    Rectal cancer Neg Hx    Stomach cancer Neg Hx     Social History:  reports that he has never smoked. He has never used smokeless tobacco. He reports that he does not drink alcohol and does not use drugs.  ROS: A complete review of systems was performed.  All systems are negative except for pertinent findings as noted.  Physical Exam:  Vital signs in last 24 hours: There were no vitals taken for this visit.  Constitutional:  Alert and oriented, No acute distress Cardiovascular: Regular rate  Respiratory: Normal respiratory effort Neurologic: Grossly intact, no focal deficits Psychiatric: Normal mood and affect   Patient taught internal penile anatomy.  I taught him how to draw up 10 mcg of the prostaglandin.  After proper training, the patient self injected in the right corporal body with 10 mcg of prostaglandin E1.  He is currently technique.  He did have a somewhat firm erection without pain  Impression/Assessment:  ED, initiating IC injections  Plan:  He will start out injecting 0.5 mL of prostaglandin E1 (20 mcg/milliliter), okay to increase up to 1.0 mL/inj.  Once he has completed his 5 vials, he will call us to let us know if that is an appropriate dose  I will have him come back in 3 months

## 2023-05-14 NOTE — Telephone Encounter (Signed)
Pt called to be seen for injection teaching with MD

## 2023-05-28 ENCOUNTER — Ambulatory Visit: Payer: Medicare PPO | Admitting: Urology

## 2023-06-12 ENCOUNTER — Other Ambulatory Visit: Payer: Self-pay | Admitting: Family Medicine

## 2023-06-12 DIAGNOSIS — I10 Essential (primary) hypertension: Secondary | ICD-10-CM

## 2023-06-29 ENCOUNTER — Other Ambulatory Visit: Payer: Self-pay | Admitting: Family Medicine

## 2023-06-29 DIAGNOSIS — I10 Essential (primary) hypertension: Secondary | ICD-10-CM

## 2023-07-01 NOTE — Telephone Encounter (Signed)
 Requested by interface surescripts. Medication discontinued 05/08/22.  Requested Prescriptions  Refused Prescriptions Disp Refills   losartan (COZAAR) 100 MG tablet [Pharmacy Med Name: Losartan Potassium 100 MG Oral Tablet] 90 tablet 0    Sig: TAKE 1 TABLET BY MOUTH ONCE DAILY*STOP LISINOPRIL*     Cardiovascular:  Angiotensin Receptor Blockers Failed - 07/01/2023  4:04 PM      Failed - Cr in normal range and within 180 days    Creat  Date Value Ref Range Status  10/04/2022 1.08 0.70 - 1.28 mg/dL Final         Failed - K in normal range and within 180 days    Potassium  Date Value Ref Range Status  10/04/2022 3.9 3.5 - 5.3 mmol/L Final         Failed - Valid encounter within last 6 months    Recent Outpatient Visits           1 year ago Bilateral carotid artery stenosis   [facility] Donita Brooks, MD   2 years ago Rib pain on left side   [facility] Donita Brooks, MD   2 years ago Encounter for Medicare annual wellness exam   [facility] Donita Brooks, MD   2 years ago Myalgia   [facility] Tanya Nones, Priscille Heidelberg, MD   2 years ago Myalgia   [facility] Pickard, Priscille Heidelberg, MD       Future Appointments             In 2 weeks Marcine Matar, MD [facility] - Patient is not pregnant      Passed - Last BP in normal range    BP Readings from Last 1 Encounters:  05/03/23 120/80

## 2023-07-12 ENCOUNTER — Other Ambulatory Visit: Payer: Self-pay | Admitting: Family Medicine

## 2023-07-12 DIAGNOSIS — I10 Essential (primary) hypertension: Secondary | ICD-10-CM

## 2023-07-15 NOTE — Progress Notes (Signed)
 History of Present Illness: Here for injection training.  The patient had apparently been on 2.5 mcg injections of Edex prior to his initial presentation here.  These were ineffective with treating his ED.  Currently he comes in with new dosing of 20 mcg/milliliter.  Past Medical History:  Diagnosis Date   Arthritis    Epistaxis    GERD (gastroesophageal reflux disease)    Hematochezia    Hypertension    Microscopic hematuria    Neuromuscular disorder (HCC)    neuropathy   Stenosis of right carotid artery greater than 50%    Thyroid nodule    repeat US in 1 year (8/24)    Past Surgical History:  Procedure Laterality Date   COLONOSCOPY     SHOULDER SURGERY      Home Medications:  Allergies as of 07/16/2023   No Known Allergies      Medication List        Accurate as of July 15, 2023  6:44 AM. If you have any questions, ask your nurse or doctor.          AMBULATORY NON FORMULARY MEDICATION Medication Name: Prostaglandin  E1 20 mcg/mL Inject 1/2-1 mL prn Disp 5 20 mcg vials  0 rf   amLODipine 10 MG tablet Commonly known as: NORVASC Take 1 tablet by mouth once daily   aspirin EC 81 MG tablet Take by mouth.   carvedilol 6.25 MG tablet Commonly known as: COREG Take 12.5 mg by mouth daily.   cyclobenzaprine 10 MG tablet Commonly known as: FLEXERIL Take 1 tablet (10 mg total) by mouth at bedtime. Take at night time   doxazosin 4 MG tablet Commonly known as: CARDURA TAKE 1 TABLET BY MOUTH ONCE DAILY *STOP CLONIDINE*   Edex 10 MCG injection Generic drug: alprostadil 2.5 mcg by Intracavitary route as needed for erectile dysfunction. use no more than 3 times per week   losartan 50 MG tablet Commonly known as: COZAAR Take 50 mg by mouth daily.   meclizine 25 MG tablet Commonly known as: ANTIVERT Take 1 tablet (25 mg total) by mouth 3 (three) times daily as needed for dizziness.   methocarbamol 500 MG tablet Commonly known as: ROBAXIN Take 1  tablet (500 mg total) by mouth 3 (three) times daily. During the day   multivitamin capsule Take 1 capsule by mouth daily.   pantoprazole 40 MG tablet Commonly known as: PROTONIX Take 1 tablet by mouth once daily   pramipexole 0.125 MG tablet Commonly known as: Mirapex Take 2 tablets (0.25 mg total) by mouth at bedtime.   rosuvastatin 20 MG tablet Commonly known as: CRESTOR Take 1/2 (one-half) tablet by mouth once daily        Allergies: No Known Allergies  Family History  Problem Relation Age of Onset   Hypertension Mother    Heart attack Mother    Stroke Father    Colon polyps Neg Hx    Colon cancer Neg Hx    Esophageal cancer Neg Hx    Rectal cancer Neg Hx    Stomach cancer Neg Hx     Social History:  reports that he has never smoked. He has never used smokeless tobacco. He reports that he does not drink alcohol and does not use drugs.  ROS: A complete review of systems was performed.  All systems are negative except for pertinent findings as noted.  Physical Exam:  Vital signs in last 24 hours: There were no vitals taken for this visit.  Constitutional:  Alert and oriented, No acute distress Cardiovascular: Regular rate  Respiratory: Normal respiratory effort Neurologic: Grossly intact, no focal deficits Psychiatric: Normal mood and affect   Patient taught internal penile anatomy.  I taught him how to draw up 10 mcg of the prostaglandin.  After proper training, the patient self injected in the right corporal body with 10 mcg of prostaglandin E1.  He is currently technique.  He did have a somewhat firm erection without pain  Impression/Assessment:  ED, initiating IC injections  Plan:  He will start out injecting 0.5 mL of prostaglandin E1 (20 mcg/milliliter), okay to increase up to 1.0 mL/inj.  Once he has completed his 5 vials, he will call us to let us know if that is an appropriate dose  I will have him come back in 3 months

## 2023-07-15 NOTE — Telephone Encounter (Signed)
 Requested Prescriptions  Refused Prescriptions Disp Refills   losartan (COZAAR) 100 MG tablet [Pharmacy Med Name: Losartan Potassium 100 MG Oral Tablet] 90 tablet 0    Sig: TAKE 1 TABLET BY MOUTH ONCE DAILY*STOP LISINOPRIL*     Cardiovascular:  Angiotensin Receptor Blockers Failed - 07/15/2023  2:39 PM      Failed - Cr in normal range and within 180 days    Creat  Date Value Ref Range Status  10/04/2022 1.08 0.70 - 1.28 mg/dL Final         Failed - K in normal range and within 180 days    Potassium  Date Value Ref Range Status  10/04/2022 3.9 3.5 - 5.3 mmol/L Final         Failed - Valid encounter within last 6 months    Recent Outpatient Visits           2 months ago Back muscle spasm   [facility] Bernadette Hoit, MD   6 months ago Hepatic steatosis   [facility] Donita Brooks, MD   9 months ago Benign essential HTN   [facility] Tanya Nones, Priscille Heidelberg, MD   1 year ago Syncope, unspecified syncope type   [facility] Donita Brooks, MD   1 year ago Carotid stenosis, right   [facility] Pickard, Priscille Heidelberg, MD       Future Appointments             Tomorrow Marcine Matar, MD [facility] - Patient is not pregnant      Passed - Last BP in normal range    BP Readings from Last 1 Encounters:  05/03/23 120/80

## 2023-07-16 ENCOUNTER — Ambulatory Visit (INDEPENDENT_AMBULATORY_CARE_PROVIDER_SITE_OTHER): Payer: Medicare PPO | Admitting: Urology

## 2023-07-16 VITALS — BP 160/82 | HR 69

## 2023-07-16 DIAGNOSIS — N5201 Erectile dysfunction due to arterial insufficiency: Secondary | ICD-10-CM | POA: Diagnosis not present

## 2023-07-16 LAB — URINALYSIS, ROUTINE W REFLEX MICROSCOPIC
Bilirubin, UA: NEGATIVE
Glucose, UA: NEGATIVE
Ketones, UA: NEGATIVE
Leukocytes,UA: NEGATIVE
Nitrite, UA: NEGATIVE
Protein,UA: NEGATIVE
RBC, UA: NEGATIVE
Specific Gravity, UA: 1.02 (ref 1.005–1.030)
Urobilinogen, Ur: 0.2 mg/dL (ref 0.2–1.0)
pH, UA: 6 (ref 5.0–7.5)

## 2023-07-16 MED ORDER — AMBULATORY NON FORMULARY MEDICATION: 11 refills | Status: AC

## 2023-07-16 NOTE — Addendum Note (Signed)
 Addended by: Sarajane Jews on: 07/16/2023 02:23 PM   Modules accepted: Orders

## 2023-07-16 NOTE — Addendum Note (Signed)
 Addended by: Sarajane Jews on: 07/16/2023 01:30 PM   Modules accepted: Orders

## 2023-07-19 ENCOUNTER — Other Ambulatory Visit: Payer: Self-pay | Admitting: Family Medicine

## 2023-07-19 NOTE — Telephone Encounter (Signed)
 Copied from CRM [id number]. Topic: Clinical - Medication Refill >> Jul 19, 2023  3:46 PM Franchot Heidelberg wrote: Most Recent Primary Care Visit:  Provider: Bernadette Hoit  Department[facility]  Visit Type: ACUTE  Date: 05/03/2023  Medication: losartan (COZAAR) 100 MG tablet  Has the patient contacted their pharmacy? Yes (Agent: If no, request that the patient contact the pharmacy for the refill. If patient does not wish to contact the pharmacy document the reason why and proceed with request.) (Agent: If yes, when and what did the pharmacy advise?)  Is this the correct pharmacy for this prescription? Yes If no, delete pharmacy and type the correct one.  This is the patient's preferred pharmacy:  [facility] - [place], [place] - [address] [place] [address] Phone: [phone] Fax: [phone]   Has the prescription been filled recently? Yes  Is the patient out of the medication? Yes  Has the patient been seen for an appointment in the last year OR does the patient have an upcoming appointment? Yes  Can we respond through MyChart? Yes  Agent: Please be advised that Rx refills may take up to 3 business days. We ask that you follow-up with your pharmacy.

## 2023-07-22 MED ORDER — LOSARTAN POTASSIUM 50 MG PO TABS: 50.0000 mg | ORAL_TABLET | Freq: Every day | ORAL | 0 refills | Status: DC

## 2023-07-22 NOTE — Telephone Encounter (Signed)
 Requested medications are due for refill today.  unsure  Requested medications are on the active medications list.  yes  Last refill. unsure  Future visit scheduled.   yes  Notes to clinic.  Medication is historical.    Requested Prescriptions  Pending Prescriptions Disp Refills   losartan (COZAAR) 50 MG tablet      Sig: Take 1 tablet (50 mg total) by mouth daily.     Cardiovascular:  Angiotensin Receptor Blockers Failed - 07/22/2023 10:24 AM      Failed - Cr in normal range and within 180 days    Creat  Date Value Ref Range Status  10/04/2022 1.08 0.70 - 1.28 mg/dL Final         Failed - K in normal range and within 180 days    Potassium  Date Value Ref Range Status  10/04/2022 3.9 3.5 - 5.3 mmol/L Final         Failed - Last BP in normal range    BP Readings from Last 1 Encounters:  07/16/23 (!) 160/82         Failed - Valid encounter within last 6 months    Recent Outpatient Visits           2 months ago Back muscle spasm   [place] [facility] Bernadette Hoit, MD   6 months ago Hepatic steatosis   [place] [facility] [facility] Donita Brooks, MD   9 months ago Benign essential HTN   [place] [facility] Tanya Nones, Priscille Heidelberg, MD   1 year ago Syncope, unspecified syncope type   [place] Donita Brooks, MD   1 year ago Carotid stenosis, right   [place] [facility] Pickard, Priscille Heidelberg, MD              Passed - Patient is not pregnant

## 2023-07-24 ENCOUNTER — Telehealth: Payer: Self-pay

## 2023-07-24 ENCOUNTER — Other Ambulatory Visit: Payer: Self-pay | Admitting: Family Medicine

## 2023-07-24 DIAGNOSIS — R079 Chest pain, unspecified: Secondary | ICD-10-CM

## 2023-07-24 NOTE — Telephone Encounter (Signed)
 The wife of the patient called in to make sure the refill request was received. I let her know it was and she said good because he is almost out

## 2023-07-24 NOTE — Telephone Encounter (Signed)
 Copied from CRM [id number]. Topic: Clinical - Prescription Issue >> Jul 24, 2023  4:27 PM Fuller Mandril wrote: Reason for CRM: Patient wife called states she has been trying to get losartan (COZAAR) 100 MG tablet . She was at pharmacy and they confirmed they had the prescription ready for the 50. She is going to go ahead and pick that one up for today but says the dose increased to 100. She would like the corrected dose to be sent in but she is picking up the one sent on the 7th now. Thank You

## 2023-07-25 NOTE — Telephone Encounter (Signed)
 Requested Prescriptions  Pending Prescriptions Disp Refills   amLODipine (NORVASC) 10 MG tablet [Pharmacy Med Name: amLODIPine Besylate 10 MG Oral Tablet] 90 tablet 0    Sig: Take 1 tablet by mouth once daily     Cardiovascular: Calcium Channel Blockers 2 Failed - 07/25/2023  9:45 AM      Failed - Last BP in normal range    BP Readings from Last 1 Encounters:  07/16/23 (!) 160/82         Failed - Valid encounter within last 6 months    Recent Outpatient Visits           2 months ago Back muscle spasm   [facility] Bernadette Hoit, MD   6 months ago Hepatic steatosis   [place] [facility] Donita Brooks, MD   9 months ago Benign essential HTN   [place] Tanya Nones, Priscille Heidelberg, MD   1 year ago Syncope, unspecified syncope type   [place] Donita Brooks, MD   1 year ago Carotid stenosis, right   [facility] Donita Brooks, MD              Passed - Last Heart Rate in normal range    Pulse Readings from Last 1 Encounters:  07/16/23 69

## 2023-08-01 ENCOUNTER — Ambulatory Visit

## 2023-08-01 VITALS — BP 160/82 | Ht 68.0 in | Wt 231.0 lb

## 2023-08-01 DIAGNOSIS — Z0000 Encounter for general adult medical examination without abnormal findings: Secondary | ICD-10-CM | POA: Diagnosis not present

## 2023-08-01 NOTE — Patient Instructions (Signed)
 Health Maintenance, Male  Adopting a healthy lifestyle and getting preventive care are important in promoting health and wellness. Ask your health care provider about:  The right schedule for you to have regular tests and exams.  Things you can do on your own to prevent diseases and keep yourself healthy.  What should I know about diet, weight, and exercise?  Eat a healthy diet    Eat a diet that includes plenty of vegetables, fruits, low-fat dairy products, and lean protein.  Do not eat a lot of foods that are high in solid fats, added sugars, or sodium.  Maintain a healthy weight  Body mass index (BMI) is a measurement that can be used to identify possible weight problems. It estimates body fat based on height and weight. Your health care provider can help determine your BMI and help you achieve or maintain a healthy weight.  Get regular exercise  Get regular exercise. This is one of the most important things you can do for your health. Most adults should:  Exercise for at least 150 minutes each week. The exercise should increase your heart rate and make you sweat (moderate-intensity exercise).  Do strengthening exercises at least twice a week. This is in addition to the moderate-intensity exercise.  Spend less time sitting. Even light physical activity can be beneficial.  Watch cholesterol and blood lipids  Have your blood tested for lipids and cholesterol at 71 years of age, then have this test every 5 years.  You may need to have your cholesterol levels checked more often if:  Your lipid or cholesterol levels are high.  You are older than 71 years of age.  You are at high risk for heart disease.  What should I know about cancer screening?  Many types of cancers can be detected early and may often be prevented. Depending on your health history and family history, you may need to have cancer screening at various ages. This may include screening for:  Colorectal cancer.  Prostate cancer.  Skin cancer.  Lung  cancer.  What should I know about heart disease, diabetes, and high blood pressure?  Blood pressure and heart disease  High blood pressure causes heart disease and increases the risk of stroke. This is more likely to develop in people who have high blood pressure readings or are overweight.  Talk with your health care provider about your target blood pressure readings.  Have your blood pressure checked:  Every 3-5 years if you are 71-95 years of age.  Every year if you are 71 years old or older.  If you are between the ages of 29 and 29 and are a current or former smoker, ask your health care provider if you should have a one-time screening for abdominal aortic aneurysm (AAA).  Diabetes  Have regular diabetes screenings. This checks your fasting blood sugar level. Have the screening done:  Once every three years after age 71 if you are at a normal weight and have a low risk for diabetes.  More often and at a younger age if you are overweight or have a high risk for diabetes.  What should I know about preventing infection?  Hepatitis B  If you have a higher risk for hepatitis B, you should be screened for this virus. Talk with your health care provider to find out if you are at risk for hepatitis B infection.  Hepatitis C  Blood testing is recommended for:  Everyone born from [birth year] through 1963.  Anyone  with known risk factors for hepatitis C.  Sexually transmitted infections (STIs)  You should be screened each year for STIs, including gonorrhea and chlamydia, if:  You are sexually active and are younger than 71 years of age.  You are older than 71 years of age and your health care provider tells you that you are at risk for this type of infection.  Your sexual activity has changed since you were last screened, and you are at increased risk for chlamydia or gonorrhea. Ask your health care provider if you are at risk.  Ask your health care provider about whether you are at high risk for HIV. Your health care provider  may recommend a prescription medicine to help prevent HIV infection. If you choose to take medicine to prevent HIV, you should first get tested for HIV. You should then be tested every 3 months for as long as you are taking the medicine.  Follow these instructions at home:  Alcohol use  Do not drink alcohol if your health care provider tells you not to drink.  If you drink alcohol:  Limit how much you have to 0-2 drinks a day.  Know how much alcohol is in your drink. In the [place] one drink equals one 12 oz bottle of beer (355 mL), one 5 oz glass of wine (148 mL), or one 1 oz glass of hard liquor (44 mL).  Lifestyle  Do not use any products that contain nicotine or tobacco. These products include cigarettes, chewing tobacco, and vaping devices, such as e-cigarettes. If you need help quitting, ask your health care provider.  Do not use street drugs.  Do not share needles.  Ask your health care provider for help if you need support or information about quitting drugs.  General instructions  Schedule regular health, dental, and eye exams.  Stay current with your vaccines.  Tell your health care provider if:  You often feel depressed.  You have ever been abused or do not feel safe at home.  Summary  Adopting a healthy lifestyle and getting preventive care are important in promoting health and wellness.  Follow your health care provider's instructions about healthy diet, exercising, and getting tested or screened for diseases.  Follow your health care provider's instructions on monitoring your cholesterol and blood pressure.  This information is not intended to replace advice given to you by your health care provider. Make sure you discuss any questions you have with your health care provider.  Document Revised: 08/22/2020 Document Reviewed: 08/22/2020  Elsevier Patient Education  2024 [employer].

## 2023-08-01 NOTE — Progress Notes (Signed)
 Subjective:   Stephen Butler is a 73 y.o. male who presents for Medicare Annual/Subsequent preventive examination.  Visit Complete: Virtual I connected with  Stephen Butler on 08/01/23 by a audio enabled telemedicine application and verified that I am speaking with the correct person using two identifiers.  Patient Location: Home  Provider Location: Home Office  I discussed the limitations of evaluation and management by telemedicine. The patient expressed understanding and agreed to proceed.  Vital Signs: Because this visit was a virtual/telehealth visit, some criteria may be missing or patient reported. Any vitals not documented were not able to be obtained and vitals that have been documented are patient reported.  Patient Medicare AWV questionnaire was completed by the patient on 08/01/2023; I have confirmed that all information answered by patient is correct and no changes since this date.        Objective:    Today's Vitals   08/01/23 0812  BP: (!) 160/82  Weight: 231 lb (104.8 kg)  Height: 5\' 8"  (1.727 m)   Body mass index is 35.12 kg/m.     08/01/2023    8:26 AM 04/05/2022   12:08 PM 02/07/2021    2:40 AM 07/16/2016   10:31 PM  Advanced Directives  Does Patient Have a Medical Advance Directive? Yes No No No  Type of Estate agent of [place];Living will     Does patient want to make changes to medical advance directive?  Yes (MAU/Ambulatory/Procedural Areas - Information given)    Copy of Healthcare Power of Attorney in Chart? No - copy requested     Would patient like information on creating a medical advance directive?   No - Patient declined No - Patient declined    Current Medications (verified) Outpatient Encounter Medications as of 08/01/2023  Medication Sig   alprostadil (EDEX) 10 MCG injection 2.5 mcg by Intracavitary route as needed for erectile dysfunction. use no more than 3 times per week   AMBULATORY NON FORMULARY MEDICATION  Medication Name: Prostaglandin  E1 20 mcg/mL Inject 1/2-1 mL prn Disp 5 20 mcg vials  0 rf   AMBULATORY NON FORMULARY MEDICATION PAP30/PHE1/PGE20  Inject 0.5-1.0 mL prn   amLODipine (NORVASC) 10 MG tablet Take 1 tablet by mouth once daily   aspirin EC 81 MG tablet Take by mouth.   carvedilol (COREG) 6.25 MG tablet Take 12.5 mg by mouth daily.   cyclobenzaprine (FLEXERIL) 10 MG tablet Take 1 tablet (10 mg total) by mouth at bedtime. Take at night time   doxazosin (CARDURA) 4 MG tablet TAKE 1 TABLET BY MOUTH ONCE DAILY *STOP CLONIDINE*   losartan (COZAAR) 50 MG tablet Take 1 tablet (50 mg total) by mouth daily.   meclizine (ANTIVERT) 25 MG tablet Take 1 tablet (25 mg total) by mouth 3 (three) times daily as needed for dizziness.   methocarbamol (ROBAXIN) 500 MG tablet Take 1 tablet (500 mg total) by mouth 3 (three) times daily. During the day   Multiple Vitamin (MULTIVITAMIN) capsule Take 1 capsule by mouth daily.   pantoprazole (PROTONIX) 40 MG tablet Take 1 tablet by mouth once daily   pramipexole (MIRAPEX) 0.125 MG tablet Take 2 tablets (0.25 mg total) by mouth at bedtime.   rosuvastatin (CRESTOR) 20 MG tablet Take 1/2 (one-half) tablet by mouth once daily   No facility-administered encounter medications on file as of 08/01/2023.    Allergies (verified) Patient has no known allergies.   History: Past Medical History:  Diagnosis Date   Arthritis  Epistaxis    GERD (gastroesophageal reflux disease)    Hematochezia    Hypertension    Microscopic hematuria    Neuromuscular disorder (HCC)    neuropathy   Stenosis of right carotid artery greater than 50%    Thyroid nodule    repeat US in 1 year (8/24)   Past Surgical History:  Procedure Laterality Date   COLONOSCOPY     SHOULDER SURGERY     Family History  Problem Relation Age of Onset   Hypertension Mother    Heart attack Mother    Stroke Father    Colon polyps Neg Hx    Colon cancer Neg Hx    Esophageal cancer Neg  Hx    Rectal cancer Neg Hx    Stomach cancer Neg Hx    Social History   Socioeconomic History   Marital status: Married    Spouse name: Not on file   Number of children: 0   Years of education: Not on file   Highest education level: Not on file  Occupational History   Occupation: retired  Tobacco Use   Smoking status: Never   Smokeless tobacco: Never  Vaping Use   Vaping status: Never Used  Substance and Sexual Activity   Alcohol use: No   Drug use: No   Sexual activity: Yes    Partners: Female  Other Topics Concern   Not on file  Social History Narrative   Retired    Social Drivers of Corporate investment banker Strain: Low Risk  (08/01/2023)   Overall Financial Resource Strain (CARDIA)    Difficulty of Paying Living Expenses: Not hard at all  Food Insecurity: No Food Insecurity (08/01/2023)   Hunger Vital Sign    Worried About Running Out of Food in the Last Year: Never true    Ran Out of Food in the Last Year: Never true  Transportation Needs: No Transportation Needs (08/01/2023)   PRAPARE - Administrator, Civil Service (Medical): No    Lack of Transportation (Non-Medical): No  Physical Activity: Insufficiently Active (08/01/2023)   Exercise Vital Sign    Days of Exercise per Week: 2 days    Minutes of Exercise per Session: 60 min  Stress: No Stress Concern Present (08/01/2023)   [employer] of Occupational Health - Occupational Stress Questionnaire    Feeling of Stress : Not at all  Social Connections: Socially Integrated (08/01/2023)   Social Connection and Isolation Panel [NHANES]    Frequency of Communication with Friends and Family: More than three times a week    Frequency of Social Gatherings with Friends and Family: More than three times a week    Attends Religious Services: More than 4 times per year    Active Member of [employer] or Organizations: Yes    Attends Engineer, structural: More than 4 times per year    Marital Status:  Married    Tobacco Counseling Counseling given: Not Answered   Clinical Intake:  Pre-visit preparation completed: Yes  Pain : No/denies pain     BMI - recorded: 35 Nutritional Status: BMI > 30  Obese Nutritional Risks: None Diabetes: No  How often do you need to have someone help you when you read instructions, pamphlets, or other written materials from your doctor or pharmacy?: 1 - Never What is the last grade level you completed in school?: 12th grade  Interpreter Needed?: No      Activities of Daily Living  No data to display          Patient Care Team: Donita Brooks, MD as PCP - General (Family Medicine) Mallipeddi, Orion Modest, MD as PCP - Cardiology (Cardiology)  Indicate any recent Medical Services you may have received from other than [facility] providers in the past year (date may be approximate).     Assessment:   This is a routine wellness examination for Stephen Butler.  Hearing/Vision screen No results found.   Goals Addressed             This Visit's Progress    Activity and Exercise Increased       Evidence-based guidance:  Review current exercise levels.  Assess patient perspective on exercise or activity level, barriers to increasing activity, motivation and readiness for change.  Recommend or set healthy exercise goal based on individual tolerance.  Encourage small steps toward making change in amount of exercise or activity.  Urge reduction of sedentary activities or screen time.  Promote group activities within the community or with family or support person.  Consider referral to rehabiliation therapist for assessment and exercise/activity plan.   Notes:       Depression Screen    08/01/2023    8:25 AM 10/04/2022    2:06 PM 04/05/2022    1:15 PM 01/08/2021    9:43 AM 01/08/2021    9:37 AM 07/31/2018    2:31 PM 07/02/2017    3:07 PM  PHQ 2/9 Scores  PHQ - 2 Score 0 0 0 0 0 0 0    Fall Risk    08/01/2023    8:26 AM 10/04/2022     2:06 PM 04/05/2022   12:01 PM 01/08/2021    9:42 AM 06/13/2020   10:35 AM  Fall Risk   Falls in the past year? 0 0 0 0 0  Number falls in past yr: 0 0 0 0 0  Injury with Fall? 0 0 0 0 0  Risk for fall due to : No Fall Risks No Fall Risks No Fall Risks No Fall Risks No Fall Risks  Follow up Falls evaluation completed Falls prevention discussed Falls evaluation completed;Education provided;Falls prevention discussed Follow up appointment Falls evaluation completed    MEDICARE RISK AT HOME: Medicare Risk at Home Any stairs in or around the home?: Yes If so, are there any without handrails?: No Home free of loose throw rugs in walkways, pet beds, electrical cords, etc?: Yes Adequate lighting in your home to reduce risk of falls?: Yes Life alert?: No Use of a cane, walker or w/c?: No Grab bars in the bathroom?: No Shower chair or bench in shower?: No Elevated toilet seat or a handicapped toilet?: No  TIMED UP AND GO:  Was the test performed?  No    Cognitive Function:        08/01/2023    8:27 AM 04/05/2022   12:03 PM 01/08/2021    9:43 AM  6CIT Screen  What Year? 0 points 0 points 0 points  What month? 0 points 0 points 0 points  What time? 0 points 0 points 0 points  Count back from 20 0 points 0 points 2 points  Months in reverse 0 points 0 points 2 points  Repeat phrase 6 points 0 points 6 points  Total Score 6 points 0 points 10 points    Immunizations Immunization History  Administered Date(s) Administered   Fluad Quad(high Dose 65+) 01/06/2021   Fluad Trivalent(High Dose 65+) 05/03/2023  Influenza, High Dose Seasonal PF 03/30/2022   PFIZER(Purple Top)SARS-COV-2 Vaccination 06/25/2019, 07/20/2019   Pneumococcal Conjugate-13 06/13/2020   Pneumococcal Polysaccharide-23 07/02/2017     Flu Vaccine status: Up to date  Pneumococcal vaccine status: Up to date  Covid-19 vaccine status: Information provided on how to obtain vaccines.   Qualifies for Shingles  Vaccine? Yes   Zostavax completed No   Shingrix Completed?: No.    Education has been provided regarding the importance of this vaccine. Patient has been advised to call insurance company to determine out of pocket expense if they have not yet received this vaccine. Advised may also receive vaccine at local pharmacy or Health Dept. Verbalized acceptance and understanding.  Screening Tests Health Maintenance  Topic Date Due   Zoster Vaccines- Shingrix (1 of 2) Never done   COVID-19 Vaccine (3 - Pfizer risk series) 08/17/2019   INFLUENZA VACCINE  11/15/2023   Medicare Annual Wellness (AWV)  07/31/2024   Colonoscopy  10/05/2025   Pneumonia Vaccine 38+ Years old  Completed   Hepatitis C Screening  Completed   HPV VACCINES  Aged Out   Meningococcal B Vaccine  Aged Out   DTaP/Tdap/Td  Discontinued    Health Maintenance  Health Maintenance Due  Topic Date Due   Zoster Vaccines- Shingrix (1 of 2) Never done   COVID-19 Vaccine (3 - Pfizer risk series) 08/17/2019    Colorectal cancer screening: Type of screening: Colonoscopy. Completed 10/05/20. Repeat every 10 years  Lung Cancer Screening: (Low Dose CT Chest recommended if Age 47-80 years, 20 pack-year currently smoking OR have quit w/in 15years.) does not qualify.   Lung Cancer Screening Referral:   Additional Screening:  Hepatitis C Screening: does qualify; Completed   Vision Screening: Recommended annual ophthalmology exams for early detection of glaucoma and other disorders of the eye. Is the patient up to date with their annual eye exam?  Yes  Who is the provider or what is the name of the office in which the patient attends annual eye exams? [facility] If pt is not established with a provider, would they like to be referred to a provider to establish care? No .   Dental Screening: Recommended annual dental exams for proper oral hygiene  Diabetic Foot Exam:   Community Resource Referral / Chronic Care Management: CRR  required this visit?  No   CCM required this visit?  No     Plan:     I have personally reviewed and noted the following in the patient's chart:   Medical and social history Use of alcohol, tobacco or illicit drugs  Current medications and supplements including opioid prescriptions. Patient is not currently taking opioid prescriptions. Functional ability and status Nutritional status Physical activity Advanced directives List of other physicians Hospitalizations, surgeries, and ER visits in previous 12 months Vitals Screenings to include cognitive, depression, and falls Referrals and appointments  In addition, I have reviewed and discussed with patient certain preventive protocols, quality metrics, and best practice recommendations. A written personalized care plan for preventive services as well as general preventive health recommendations were provided to patient.     Alvina Axon   08/01/2023   After Visit Summary: (MyChart) Due to this being a telephonic visit, the after visit summary with patients personalized plan was offered to patient via MyChart   Nurse Notes:   Mr. Mukai , Thank you for taking time to come for your Medicare Wellness Visit. I appreciate your ongoing commitment to your health goals. Please review  the following plan we discussed and let me know if I can assist you in the future.   These are the goals we discussed:  Goals      Activity and Exercise Increased     Evidence-based guidance:  Review current exercise levels.  Assess patient perspective on exercise or activity level, barriers to increasing activity, motivation and readiness for change.  Recommend or set healthy exercise goal based on individual tolerance.  Encourage small steps toward making change in amount of exercise or activity.  Urge reduction of sedentary activities or screen time.  Promote group activities within the community or with family or support person.  Consider  referral to rehabiliation therapist for assessment and exercise/activity plan.   Notes:         This is a list of the screening recommended for you and due dates:  Health Maintenance  Topic Date Due   Zoster (Shingles) Vaccine (1 of 2) Never done   COVID-19 Vaccine (3 - Pfizer risk series) 08/17/2019   Flu Shot  11/15/2023   Medicare Annual Wellness Visit  07/31/2024   Colon Cancer Screening  10/05/2025   Pneumonia Vaccine  Completed   Hepatitis C Screening  Completed   HPV Vaccine  Aged Out   Meningitis B Vaccine  Aged Out   DTaP/Tdap/Td vaccine  Discontinued

## 2023-08-02 ENCOUNTER — Ambulatory Visit: Admitting: Family Medicine

## 2023-08-02 ENCOUNTER — Encounter: Payer: Self-pay | Admitting: Family Medicine

## 2023-08-02 VITALS — BP 142/84 | HR 64 | Temp 98.4°F | Ht 68.0 in | Wt 230.0 lb

## 2023-08-02 DIAGNOSIS — E7800 Pure hypercholesterolemia, unspecified: Secondary | ICD-10-CM

## 2023-08-02 DIAGNOSIS — I10 Essential (primary) hypertension: Secondary | ICD-10-CM | POA: Diagnosis not present

## 2023-08-02 DIAGNOSIS — R6882 Decreased libido: Secondary | ICD-10-CM

## 2023-08-02 DIAGNOSIS — I6521 Occlusion and stenosis of right carotid artery: Secondary | ICD-10-CM

## 2023-08-02 DIAGNOSIS — Z0000 Encounter for general adult medical examination without abnormal findings: Secondary | ICD-10-CM

## 2023-08-02 DIAGNOSIS — Z0001 Encounter for general adult medical examination with abnormal findings: Secondary | ICD-10-CM

## 2023-08-02 DIAGNOSIS — Z125 Encounter for screening for malignant neoplasm of prostate: Secondary | ICD-10-CM | POA: Diagnosis not present

## 2023-08-02 NOTE — Progress Notes (Signed)
 Subjective:    Patient ID: Stephen Butler, male    DOB: 04-Apr-1951, 73 y.o.   MRN: [id number]  HPI   Patient is a very pleasant 73 year old African-American man who presents today for a complete physical exam.  Last colonoscopy was 2022 and is due again in 2027.  Overdue for psa.  Had carotid dopplers 7/24: Summary:  Right Carotid: Velocities in the right ICA are consistent with a 40-59%                 stenosis. The ECA appears >50% stenosed.   Left Carotid: Velocities in the left ICA are consistent with a 1-39%  stenosis.  Sees vascular surgery annually with [facility].   Patient is due for the shingles vaccine.  He declines this today.  He has had the pneumonia vaccine.  He reports decreased libido.  When I last saw the patient he was having muscle pain.  His CK levels were elevated.  He is not sure if he is taking the rosuvastatin  at present.  He states the muscle pains however have gone away.  He does report burning pain in his legs primarily at night.  He is not sure if he is on Mirapex .  He believes he is taking gabapentin  however this is not on his medication list. Past Medical History:  Diagnosis Date   Arthritis    Epistaxis    GERD (gastroesophageal reflux disease)    Hematochezia    Hypertension    Microscopic hematuria    Neuromuscular disorder (HCC)    neuropathy   Stenosis of right carotid artery greater than 50%    Thyroid  nodule    repeat us  in 1 year (8/24)   Past Surgical History:  Procedure Laterality Date   COLONOSCOPY     SHOULDER SURGERY     Current Outpatient Medications on File Prior to Visit  Medication Sig Dispense Refill   alprostadil  (EDEX ) 10 MCG injection 2.5 mcg by Intracavitary route as needed for erectile dysfunction. use no more than 3 times per week 1 each 12   AMBULATORY NON FORMULARY MEDICATION Medication Name: Prostaglandin  E1 20 mcg/mL Inject 1/2-1 mL prn Disp 5 20 mcg vials  0 rf 1 vial 0   AMBULATORY NON FORMULARY  MEDICATION PAP30/PHE1/PGE20  Inject 0.5-1.0 mL prn 5 vial 11   amLODipine  (NORVASC ) 10 MG tablet Take 1 tablet by mouth once daily 90 tablet 0   aspirin  EC 81 MG tablet Take by mouth.     carvedilol (COREG) 6.25 MG tablet Take 12.5 mg by mouth daily.     cyclobenzaprine  (FLEXERIL ) 10 MG tablet Take 1 tablet (10 mg total) by mouth at bedtime. Take at night time 30 tablet 0   doxazosin  (CARDURA ) 4 MG tablet TAKE 1 TABLET BY MOUTH ONCE DAILY *STOP CLONIDINE * 30 tablet 0   losartan  (COZAAR ) 50 MG tablet Take 1 tablet (50 mg total) by mouth daily. 90 tablet 0   meclizine  (ANTIVERT ) 25 MG tablet Take 1 tablet (25 mg total) by mouth 3 (three) times daily as needed for dizziness. 30 tablet 0   methocarbamol  (ROBAXIN ) 500 MG tablet Take 1 tablet (500 mg total) by mouth 3 (three) times daily. During the day 30 tablet 0   Multiple Vitamin (MULTIVITAMIN) capsule Take 1 capsule by mouth daily.     pantoprazole  (PROTONIX ) 40 MG tablet Take 1 tablet by mouth once daily 90 tablet 0   pramipexole  (MIRAPEX ) 0.125 MG tablet Take 2 tablets (0.25 mg  total) by mouth at bedtime. 60 tablet 1   rosuvastatin  (CRESTOR ) 20 MG tablet Take 1/2 (one-half) tablet by mouth once daily 45 tablet 0   No current facility-administered medications on file prior to visit.    No Known Allergies Social History   Socioeconomic History   Marital status: Married    Spouse name: Not on file   Number of children: 0   Years of education: Not on file   Highest education level: Not on file  Occupational History   Occupation: retired  Tobacco Use   Smoking status: Never   Smokeless tobacco: Never  Vaping Use   Vaping status: Never Used  Substance and Sexual Activity   Alcohol use: No   Drug use: No   Sexual activity: Yes    Partners: Female  Other Topics Concern   Not on file  Social History Narrative   Retired    Social Drivers of Corporate investment banker Strain: Low Risk  (08/01/2023)   Overall Financial Resource  Strain (CARDIA)    Difficulty of Paying Living Expenses: Not hard at all  Food Insecurity: No Food Insecurity (08/01/2023)   Hunger Vital Sign    Worried About Running Out of Food in the Last Year: Never true    Ran Out of Food in the Last Year: Never true  Transportation Needs: No Transportation Needs (08/01/2023)   PRAPARE - Administrator, Civil Service (Medical): No    Lack of Transportation (Non-Medical): No  Physical Activity: Insufficiently Active (08/01/2023)   Exercise Vital Sign    Days of Exercise per Week: 2 days    Minutes of Exercise per Session: 60 min  Stress: No Stress Concern Present (08/01/2023)   [employer] of Occupational Health - Occupational Stress Questionnaire    Feeling of Stress : Not at all  Social Connections: Socially Integrated (08/01/2023)   Social Connection and Isolation Panel [NHANES]    Frequency of Communication with Friends and Family: More than three times a week    Frequency of Social Gatherings with Friends and Family: More than three times a week    Attends Religious Services: More than 4 times per year    Active Member of [employer] or Organizations: Yes    Attends Engineer, structural: More than 4 times per year    Marital Status: Married  Catering manager Violence: Not At Risk (08/01/2023)   Humiliation, Afraid, Rape, and Kick questionnaire    Fear of Current or Ex-Partner: No    Emotionally Abused: No    Physically Abused: No    Sexually Abused: No   Family History  Problem Relation Age of Onset   Hypertension Mother    Heart attack Mother    Stroke Father    Colon polyps Neg Hx    Colon cancer Neg Hx    Esophageal cancer Neg Hx    Rectal cancer Neg Hx    Stomach cancer Neg Hx      Review of Systems  All other systems reviewed and are negative.      Objective:   Physical Exam Vitals reviewed.  Constitutional:      General: He is not in acute distress.    Appearance: Normal appearance. He is  well-developed and normal weight. He is not ill-appearing, toxic-appearing or diaphoretic.  HENT:     Head: Normocephalic and atraumatic.     Right Ear: Tympanic membrane and ear canal normal.     Left Ear: Tympanic  membrane and ear canal normal.     Nose: Nose normal. No congestion or rhinorrhea.     Mouth/Throat:     Mouth: Mucous membranes are moist.     Pharynx: Oropharynx is clear. No oropharyngeal exudate or posterior oropharyngeal erythema.  Eyes:     Extraocular Movements: Extraocular movements intact.     Conjunctiva/sclera: Conjunctivae normal.     Pupils: Pupils are equal, round, and reactive to light.  Neck:     Vascular: Carotid bruit present.  Cardiovascular:     Rate and Rhythm: Normal rate and regular rhythm.     Heart sounds: Normal heart sounds. No murmur heard.    No friction rub. No gallop.  Pulmonary:     Effort: Pulmonary effort is normal. No respiratory distress.     Breath sounds: Normal breath sounds. No stridor. No wheezing, rhonchi or rales.  Chest:     Chest wall: No tenderness.  Abdominal:     General: Bowel sounds are normal. There is no distension.     Palpations: Abdomen is soft.     Tenderness: There is no abdominal tenderness. There is no guarding or rebound.  Genitourinary:    Penis: Normal.      Testes: Normal.  Musculoskeletal:        General: No swelling, tenderness, deformity or signs of injury.     Cervical back: Neck supple.     Right lower leg: No edema.     Left lower leg: No edema.  Lymphadenopathy:     Cervical: No cervical adenopathy.  Skin:    Coloration: Skin is not jaundiced.     Findings: No bruising, erythema, lesion or rash.  Neurological:     General: No focal deficit present.     Mental Status: He is alert and oriented to person, place, and time. Mental status is at baseline.     Cranial Nerves: No cranial nerve deficit.     Sensory: No sensory deficit.     Motor: No weakness or abnormal muscle tone.      Coordination: Coordination normal.     Gait: Gait normal.     Deep Tendon Reflexes: Reflexes are normal and symmetric.  Psychiatric:        Mood and Affect: Mood normal.        Behavior: Behavior normal.        Thought Content: Thought content normal.        Judgment: Judgment normal.           Assessment & Plan:  Benign essential HTN - Plan: CBC with Differential/Platelet, COMPLETE METABOLIC PANEL WITHOUT GFR, Lipid panel  Carotid stenosis, right  Pure hypercholesterolemia  General medical exam  Prostate cancer screening - Plan: PSA  Low libido - Plan: Testosterone , TSH Given the decreased libido, I will check a TSH and a testosterone  level.  If levels are normal, consider switching his beta-blocker to see if this will help.  Patient is already on intracavernosal prostaglandin injections with minimal benefit.  Screen for prostate cancer with a PSA.  Colon cancer screening is up-to-date.  Recommended the shingles vaccine but the patient declines this.  Recommended the patient go home and verify if he is taking rosuvastatin .  He has known carotid artery stenosis.  I want him to take a statin.  He is not taking the rosuvastatin  I would plan on switching him to Lipitor.  Patient is uncertain if he is on Mirapex .  Recommended he verify if he is taking the  medication.  If he is and it is not helpful I would switch to gabapentin 

## 2023-08-03 LAB — CBC WITH DIFFERENTIAL/PLATELET
Absolute Lymphocytes: 2758 {cells}/uL (ref 850–3900)
Absolute Monocytes: 582 {cells}/uL (ref 200–950)
Basophils Absolute: 32 {cells}/uL (ref 0–200)
Basophils Relative: 0.5 %
Eosinophils Absolute: 122 {cells}/uL (ref 15–500)
Eosinophils Relative: 1.9 %
HCT: 41.7 % (ref 38.5–50.0)
Hemoglobin: 13.4 g/dL (ref 13.2–17.1)
MCH: 26.3 pg — ABNORMAL LOW (ref 27.0–33.0)
MCHC: 32.1 g/dL (ref 32.0–36.0)
MCV: 81.9 fL (ref 80.0–100.0)
MPV: 11.5 fL (ref 7.5–12.5)
Monocytes Relative: 9.1 %
Neutro Abs: 2906 {cells}/uL (ref 1500–7800)
Neutrophils Relative %: 45.4 %
Platelets: 210 10*3/uL (ref 140–400)
RBC: 5.09 10*6/uL (ref 4.20–5.80)
RDW: 14.3 % (ref 11.0–15.0)
Total Lymphocyte: 43.1 %
WBC: 6.4 10*3/uL (ref 3.8–10.8)

## 2023-08-03 LAB — TESTOSTERONE: Testosterone: 514 ng/dL (ref 250–827)

## 2023-08-03 LAB — COMPLETE METABOLIC PANEL WITHOUT GFR
AG Ratio: 1.4 (calc) (ref 1.0–2.5)
ALT: 36 U/L (ref 9–46)
AST: 23 U/L (ref 10–35)
Albumin: 4.2 g/dL (ref 3.6–5.1)
Alkaline phosphatase (APISO): 183 U/L — ABNORMAL HIGH (ref 35–144)
BUN: 8 mg/dL (ref 7–25)
CO2: 28 mmol/L (ref 20–32)
Calcium: 9.4 mg/dL (ref 8.6–10.3)
Chloride: 105 mmol/L (ref 98–110)
Creat: 1 mg/dL (ref 0.70–1.28)
Globulin: 3 g/dL (ref 1.9–3.7)
Glucose, Bld: 100 mg/dL — ABNORMAL HIGH (ref 65–99)
Potassium: 3.9 mmol/L (ref 3.5–5.3)
Sodium: 140 mmol/L (ref 135–146)
Total Bilirubin: 0.4 mg/dL (ref 0.2–1.2)
Total Protein: 7.2 g/dL (ref 6.1–8.1)

## 2023-08-03 LAB — LIPID PANEL
Cholesterol: 124 mg/dL (ref <200)
HDL: 52 mg/dL (ref >=40)
LDL Cholesterol (Calc): 53 mg/dL
Non-HDL Cholesterol (Calc): 72 mg/dL (ref <130)
Total CHOL/HDL Ratio: 2.4 (calc) (ref <5.0)
Triglycerides: 106 mg/dL (ref <150)

## 2023-08-03 LAB — TSH: TSH: 1.05 m[IU]/L (ref 0.40–4.50)

## 2023-08-03 LAB — PSA: PSA: 2.31 ng/mL (ref <=4.00)

## 2023-08-07 ENCOUNTER — Ambulatory Visit: Admitting: Family Medicine

## 2023-08-07 ENCOUNTER — Other Ambulatory Visit: Payer: Self-pay | Admitting: Family Medicine

## 2023-08-07 ENCOUNTER — Encounter: Payer: Self-pay | Admitting: Family Medicine

## 2023-08-07 VITALS — BP 128/86 | HR 65 | Temp 98.6°F | Ht 68.0 in | Wt 227.5 lb

## 2023-08-07 DIAGNOSIS — R079 Chest pain, unspecified: Secondary | ICD-10-CM

## 2023-08-07 DIAGNOSIS — R202 Paresthesia of skin: Secondary | ICD-10-CM | POA: Diagnosis not present

## 2023-08-07 DIAGNOSIS — B028 Zoster with other complications: Secondary | ICD-10-CM

## 2023-08-07 MED ORDER — VALACYCLOVIR HCL 1 G PO TABS: 1000.0000 mg | ORAL_TABLET | Freq: Three times a day (TID) | ORAL | 0 refills | Status: AC

## 2023-08-07 MED ORDER — GABAPENTIN 100 MG PO CAPS
100.0000 mg | ORAL_CAPSULE | Freq: Three times a day (TID) | ORAL | 0 refills | Status: AC
Start: 2023-08-07 — End: 2023-09-06

## 2023-08-07 NOTE — Progress Notes (Signed)
 Patient Office Visit  Assessment & Plan:  Herpes zoster with other complication -     valACYclovir  HCl; Take 1 tablet (1,000 mg total) by mouth 3 (three) times daily.  Dispense: 21 tablet; Refill: 0 -     Gabapentin ; Take 1 capsule (100 mg total) by mouth 3 (three) times daily. Start taking one at night time and inc up to 3 at night (for burning)  Dispense: 90 capsule; Refill: 0  Paresthesias -     valACYclovir  HCl; Take 1 tablet (1,000 mg total) by mouth 3 (three) times daily.  Dispense: 21 tablet; Refill: 0 -     Gabapentin ; Take 1 capsule (100 mg total) by mouth 3 (three) times daily. Start taking one at night time and inc up to 3 at night (for burning)  Dispense: 90 capsule; Refill: 0   Valtrex  1 g 3 times daily for 7 days.  Gabapentin  100 mg at night and increase to 300 mg nightly.  Patient will call us  back if not improving worsening symptoms.  Patient will get the Shingrix vaccine when this episode has resolved. No follow-ups on file.   Subjective:    Patient ID: Stephen Butler, male    DOB: [birth year]  Age: 73 y.o. MRN: [id number]  Chief Complaint  Patient presents with   Neck Pain    Right sided neck pain x 4 days.    Sore    Pt has noticed sores on the back of his head x 4 days.    Neck Pain    Right sided earlobe burning and right sided neck soreness started Sunday.  Right earlobe feels like it's burning/tingling and bothers him to sleep on the right side. Pt thought maybe something bit him over right posterior neck area but not sure. Pt does shave his head.   Right posterior neck soreness with a bump came up on Tuesday, feels swollen and sore to the tough. Pt not sure if he had blisters or not. No fever or chills. No previous hx of shingles. Pt has not received Shingrix vaccines. In fact Dr. Cheril Cork recommended the Shingrix vaccine few days ago but patient declined.   The ASCVD Risk score (Arnett DK, et al., 2019) failed to calculate for the following reasons:   The  valid total cholesterol range is 130 to 320 mg/dL  Past Medical History:  Diagnosis Date   Arthritis    Epistaxis    GERD (gastroesophageal reflux disease)    Hematochezia    Hypertension    Microscopic hematuria    Neuromuscular disorder (HCC)    neuropathy   Stenosis of right carotid artery greater than 50%    Thyroid  nodule    repeat us  in 1 year (8/24)   Past Surgical History:  Procedure Laterality Date   COLONOSCOPY     SHOULDER SURGERY     Social History   Tobacco Use   Smoking status: Never   Smokeless tobacco: Never  Vaping Use   Vaping status: Never Used  Substance Use Topics   Alcohol use: No   Drug use: No   Family History  Problem Relation Age of Onset   Hypertension Mother    Heart attack Mother    Stroke Father    Colon polyps Neg Hx    Colon cancer Neg Hx    Esophageal cancer Neg Hx    Rectal cancer Neg Hx    Stomach cancer Neg Hx    No Known Allergies  Review of Systems  Musculoskeletal:  Positive for neck pain.      Objective:    BP 128/86   Pulse 65   Temp 98.6 F (37 C)   Ht 5\' 8"  (1.727 m)   Wt 227 lb 8 oz (103.2 kg)   SpO2 98%   BMI 34.59 kg/m  BP Readings from Last 3 Encounters:  08/07/23 128/86  08/02/23 (!) 142/84  08/01/23 (!) 160/82   Wt Readings from Last 3 Encounters:  08/07/23 227 lb 8 oz (103.2 kg)  08/02/23 230 lb (104.3 kg)  08/01/23 231 lb (104.8 kg)    Physical Exam Vitals and nursing note reviewed.  Constitutional:      General: He is not in acute distress.    Appearance: Normal appearance.  HENT:     Head: Normocephalic.     Right Ear: Tympanic membrane and ear canal normal.     Left Ear: Tympanic membrane and ear canal normal.  Eyes:     Extraocular Movements: Extraocular movements intact.     Conjunctiva/sclera: Conjunctivae normal.     Pupils: Pupils are equal, round, and reactive to light.  Cardiovascular:     Rate and Rhythm: Normal rate and regular rhythm.     Heart sounds: Normal heart  sounds.  Pulmonary:     Effort: Pulmonary effort is normal.     Breath sounds: Normal breath sounds. No wheezing.  Skin:    Findings: Erythema, lesion and rash present.     Comments: Patient has 1.5 cm indurated tender area posterior/occipital area with 2 tiny lesions (?previous vesicles), no purulence. Pt has exquisite tenderness over right earlobe, no lesions noted over earlobe or external canal.   Neurological:     General: No focal deficit present.     Mental Status: He is alert and oriented to person, place, and time.      No results found for any visits on 08/07/23.

## 2023-08-08 NOTE — Telephone Encounter (Signed)
 Duplicate request, refilled 07/25/23.  Requested Prescriptions  Pending Prescriptions Disp Refills   amLODipine  (NORVASC ) 10 MG tablet [Pharmacy Med Name: amLODIPine  Besylate 10 MG Oral Tablet] 90 tablet 0    Sig: Take 1 tablet by mouth once daily     Cardiovascular: Calcium  Channel Blockers 2 Failed - 08/08/2023  1:38 PM      Failed - Valid encounter within last 6 months    Recent Outpatient Visits           Yesterday Herpes zoster with other complication   [place] [facility] Amadeo June, MD   6 days ago Benign essential HTN   [place] [facility] Austine Lefort, MD   3 months ago Back muscle spasm   [place] [facility] Amadeo June, MD   7 months ago Hepatic steatosis   [place] [facility] Cheril Cork, Cisco Crest, MD   10 months ago Benign essential HTN   [place] [facility] Pickard, Cisco Crest, MD              Passed - Last BP in normal range    BP Readings from Last 1 Encounters:  08/07/23 128/86         Passed - Last Heart Rate in normal range    Pulse Readings from Last 1 Encounters:  08/07/23 65

## 2023-08-09 ENCOUNTER — Other Ambulatory Visit: Payer: Self-pay | Admitting: Family Medicine

## 2023-08-09 DIAGNOSIS — R079 Chest pain, unspecified: Secondary | ICD-10-CM

## 2023-08-09 NOTE — Telephone Encounter (Signed)
 Requested Prescriptions  Pending Prescriptions Disp Refills   amLODipine  (NORVASC ) 10 MG tablet [Pharmacy Med Name: amLODIPine  Besylate 10 MG Oral Tablet] 90 tablet 0    Sig: Take 1 tablet by mouth once daily     Cardiovascular: Calcium  Channel Blockers 2 Failed - 08/09/2023  2:34 PM      Failed - Valid encounter within last 6 months    Recent Outpatient Visits           2 days ago Herpes zoster with other complication   [place] [facility] Amadeo June, MD   1 week ago Benign essential HTN   [place] [facility] Austine Lefort, MD   3 months ago Back muscle spasm   [place] [facility] Amadeo June, MD   7 months ago Hepatic steatosis   [place] [facility] Cheril Cork, Cisco Crest, MD   10 months ago Benign essential HTN    [facility] Pickard, Cisco Crest, MD              Passed - Last BP in normal range    BP Readings from Last 1 Encounters:  08/07/23 128/86         Passed - Last Heart Rate in normal range    Pulse Readings from Last 1 Encounters:  08/07/23 65

## 2023-08-12 NOTE — Progress Notes (Signed)
 Subjective:   Stephen Butler is a 73 y.o. male who presents for Medicare Annual/Subsequent preventive examination.  Visit Complete: Virtual I connected with  Stephen Butler on 08/01/2023 by a audio enabled telemedicine application and verified that I am speaking with the correct person using two identifiers.  Patient Location: Home  Provider Location: Home Office  I discussed the limitations of evaluation and management by telemedicine. The patient expressed understanding and agreed to proceed.  Vital Signs: Because this visit was a virtual/telehealth visit, some criteria may be missing or patient reported. Any vitals not documented were not able to be obtained and vitals that have been documented are patient reported.  Patient Medicare AWV questionnaire was completed by the patient on 08/01/2023; I have confirmed that all information answered by patient is correct and no changes since this date.  Cardiac Risk Factors include: advanced age (>5men, >21 women);male gender;hypertension;dyslipidemia     Objective:    Today's Vitals   08/01/23 0812  BP: (!) 160/82  Weight: 231 lb (104.8 kg)  Height: 5\' 8"  (1.727 m)   Body mass index is 35.12 kg/m.     08/01/2023    8:26 AM 04/05/2022   12:08 PM 02/07/2021    2:40 AM 07/16/2016   10:31 PM  Advanced Directives  Does Patient Have a Medical Advance Directive? Yes No No No  Type of Estate agent of [place];Living will     Does patient want to make changes to medical advance directive?  Yes (MAU/Ambulatory/Procedural Areas - Information given)    Copy of Healthcare Power of Attorney in Chart? No - copy requested     Would patient like information on creating a medical advance directive?   No - Patient declined No - Patient declined    Current Medications (verified) Outpatient Encounter Medications as of 08/01/2023  Medication Sig   alprostadil  (EDEX ) 10 MCG injection 2.5 mcg by Intracavitary route as needed  for erectile dysfunction. use no more than 3 times per week   AMBULATORY NON FORMULARY MEDICATION Medication Name: Prostaglandin  E1 20 mcg/mL Inject 1/2-1 mL prn Disp 5 20 mcg vials  0 rf   AMBULATORY NON FORMULARY MEDICATION PAP30/PHE1/PGE20  Inject 0.5-1.0 mL prn   amLODipine  (NORVASC ) 10 MG tablet Take 1 tablet by mouth once daily   aspirin  EC 81 MG tablet Take by mouth.   carvedilol (COREG) 6.25 MG tablet Take 12.5 mg by mouth daily.   cyclobenzaprine  (FLEXERIL ) 10 MG tablet Take 1 tablet (10 mg total) by mouth at bedtime. Take at night time   doxazosin  (CARDURA ) 4 MG tablet TAKE 1 TABLET BY MOUTH ONCE DAILY *STOP CLONIDINE *   losartan  (COZAAR ) 50 MG tablet Take 1 tablet (50 mg total) by mouth daily. (Patient taking differently: Take 50 mg by mouth daily. 08/02/23-Pt states he is taking 100 mg)   meclizine  (ANTIVERT ) 25 MG tablet Take 1 tablet (25 mg total) by mouth 3 (three) times daily as needed for dizziness.   Multiple Vitamin (MULTIVITAMIN) capsule Take 1 capsule by mouth daily.   pantoprazole  (PROTONIX ) 40 MG tablet Take 1 tablet by mouth once daily   pramipexole  (MIRAPEX ) 0.125 MG tablet Take 2 tablets (0.25 mg total) by mouth at bedtime.   rosuvastatin  (CRESTOR ) 20 MG tablet Take 1/2 (one-half) tablet by mouth once daily   [DISCONTINUED] methocarbamol  (ROBAXIN ) 500 MG tablet Take 1 tablet (500 mg total) by mouth 3 (three) times daily. During the day   No facility-administered encounter medications on file as  of 08/01/2023.    Allergies (verified) Patient has no known allergies.   History: Past Medical History:  Diagnosis Date   Arthritis    Epistaxis    GERD (gastroesophageal reflux disease)    Hematochezia    Hypertension    Microscopic hematuria    Neuromuscular disorder (HCC)    neuropathy   Stenosis of right carotid artery greater than 50%    Thyroid  nodule    repeat us  in 1 year (8/24)   Past Surgical History:  Procedure Laterality Date   COLONOSCOPY      SHOULDER SURGERY     Family History  Problem Relation Age of Onset   Hypertension Mother    Heart attack Mother    Stroke Father    Colon polyps Neg Hx    Colon cancer Neg Hx    Esophageal cancer Neg Hx    Rectal cancer Neg Hx    Stomach cancer Neg Hx    Social History   Socioeconomic History   Marital status: Married    Spouse name: Not on file   Number of children: 0   Years of education: Not on file   Highest education level: Not on file  Occupational History   Occupation: retired  Tobacco Use   Smoking status: Never   Smokeless tobacco: Never  Vaping Use   Vaping status: Never Used  Substance and Sexual Activity   Alcohol use: No   Drug use: No   Sexual activity: Yes    Partners: Female  Other Topics Concern   Not on file  Social History Narrative   Retired    Social Drivers of Corporate investment banker Strain: Low Risk  (08/01/2023)   Overall Financial Resource Strain (CARDIA)    Difficulty of Paying Living Expenses: Not hard at all  Food Insecurity: No Food Insecurity (08/01/2023)   Hunger Vital Sign    Worried About Running Out of Food in the Last Year: Never true    Ran Out of Food in the Last Year: Never true  Transportation Needs: No Transportation Needs (08/01/2023)   PRAPARE - Administrator, Civil Service (Medical): No    Lack of Transportation (Non-Medical): No  Physical Activity: Insufficiently Active (08/01/2023)   Exercise Vital Sign    Days of Exercise per Week: 2 days    Minutes of Exercise per Session: 60 min  Stress: No Stress Concern Present (08/01/2023)   [employer] of Occupational Health - Occupational Stress Questionnaire    Feeling of Stress : Not at all  Social Connections: Socially Integrated (08/01/2023)   Social Connection and Isolation Panel [NHANES]    Frequency of Communication with Friends and Family: More than three times a week    Frequency of Social Gatherings with Friends and Family: More than three times  a week    Attends Religious Services: More than 4 times per year    Active Member of [employer] or Organizations: Yes    Attends Engineer, structural: More than 4 times per year    Marital Status: Married    Tobacco Counseling Counseling given: Not Answered   Clinical Intake:  Pre-visit preparation completed: Yes  Pain : No/denies pain     BMI - recorded: 35 Nutritional Status: BMI > 30  Obese Nutritional Risks: None Diabetes: No  How often do you need to have someone help you when you read instructions, pamphlets, or other written materials from your doctor or pharmacy?: 1 - Never  What is the last grade level you completed in school?: 12th grade  Interpreter Needed?: No      Activities of Daily Living    08/12/2023   10:52 AM  In your present state of health, do you have any difficulty performing the following activities:  Hearing? 0  Vision? 0  Difficulty concentrating or making decisions? 0  Walking or climbing stairs? 0  Dressing or bathing? 0  Doing errands, shopping? 0  Preparing Food and eating ? N  Using the Toilet? N  In the past six months, have you accidently leaked urine? N  Do you have problems with loss of bowel control? N  Managing your Medications? N  Managing your Finances? N  Housekeeping or managing your Housekeeping? N    Patient Care Team: Austine Lefort, MD as PCP - General (Family Medicine) Mallipeddi, Kennyth Pean, MD as PCP - Cardiology (Cardiology)  Indicate any recent Medical Services you may have received from other than [facility] providers in the past year (date may be approximate).     Assessment:   This is a routine wellness examination for Dvid.  Hearing/Vision screen No results found.   Goals Addressed             This Visit's Progress    Activity and Exercise Increased       Evidence-based guidance:  Review current exercise levels.  Assess patient perspective on exercise or activity level, barriers to  increasing activity, motivation and readiness for change.  Recommend or set healthy exercise goal based on individual tolerance.  Encourage small steps toward making change in amount of exercise or activity.  Urge reduction of sedentary activities or screen time.  Promote group activities within the community or with family or support person.  Consider referral to rehabiliation therapist for assessment and exercise/activity plan.   Notes:       Depression Screen    08/02/2023   11:43 AM 08/01/2023    8:25 AM 10/04/2022    2:06 PM 04/05/2022    1:15 PM 01/08/2021    9:43 AM 01/08/2021    9:37 AM 07/31/2018    2:31 PM  PHQ 2/9 Scores  PHQ - 2 Score 0 0 0 0 0 0 0    Fall Risk    08/07/2023    9:06 AM 08/02/2023   11:43 AM 08/01/2023    8:26 AM 10/04/2022    2:06 PM 04/05/2022   12:01 PM  Fall Risk   Falls in the past year? 0 0 0 0 0  Number falls in past yr: 0 0 0 0 0  Injury with Fall? 0 0 0 0 0  Risk for fall due to :  No Fall Risks No Fall Risks No Fall Risks No Fall Risks  Follow up  Falls prevention discussed;Falls evaluation completed Falls evaluation completed Falls prevention discussed Falls evaluation completed;Education provided;Falls prevention discussed    MEDICARE RISK AT HOME: Medicare Risk at Home Any stairs in or around the home?: Yes If so, are there any without handrails?: No Home free of loose throw rugs in walkways, pet beds, electrical cords, etc?: Yes Adequate lighting in your home to reduce risk of falls?: Yes Life alert?: No Use of a cane, walker or w/c?: No Grab bars in the bathroom?: No Shower chair or bench in shower?: No Elevated toilet seat or a handicapped toilet?: No  TIMED UP AND GO:  Was the test performed?  No    Cognitive Function:  08/01/2023    8:27 AM 04/05/2022   12:03 PM 01/08/2021    9:43 AM  6CIT Screen  What Year? 0 points 0 points 0 points  What month? 0 points 0 points 0 points  What time? 0 points 0 points 0 points   Count back from 20 0 points 0 points 2 points  Months in reverse 0 points 0 points 2 points  Repeat phrase 6 points 0 points 6 points  Total Score 6 points 0 points 10 points    Immunizations Immunization History  Administered Date(s) Administered   Fluad Quad(high Dose 65+) 01/06/2021   Fluad Trivalent(High Dose 65+) 05/03/2023   Influenza, High Dose Seasonal PF 03/30/2022   PFIZER(Purple Top)SARS-COV-2 Vaccination 06/25/2019, 07/20/2019   Pneumococcal Conjugate-13 06/13/2020   Pneumococcal Polysaccharide-23 07/02/2017     Flu Vaccine status: Up to date  Pneumococcal vaccine status: Up to date  Covid-19 vaccine status: Information provided on how to obtain vaccines.   Qualifies for Shingles Vaccine? Yes   Zostavax completed No   Shingrix Completed?: No.    Education has been provided regarding the importance of this vaccine. Patient has been advised to call insurance company to determine out of pocket expense if they have not yet received this vaccine. Advised may also receive vaccine at local pharmacy or Health Dept. Verbalized acceptance and understanding.  Screening Tests Health Maintenance  Topic Date Due   Zoster Vaccines- Shingrix (1 of 2) Never done   COVID-19 Vaccine (3 - Pfizer risk series) 08/18/2023 (Originally 08/17/2019)   INFLUENZA VACCINE  11/15/2023   Medicare Annual Wellness (AWV)  07/31/2024   Colonoscopy  10/05/2025   Pneumonia Vaccine 75+ Years old  Completed   Hepatitis C Screening  Completed   HPV VACCINES  Aged Out   Meningococcal B Vaccine  Aged Out   DTaP/Tdap/Td  Discontinued    Health Maintenance  Health Maintenance Due  Topic Date Due   Zoster Vaccines- Shingrix (1 of 2) Never done    Colorectal cancer screening: Type of screening: Colonoscopy. Completed 10/05/20. Repeat every 10 years  Lung Cancer Screening: (Low Dose CT Chest recommended if Age 11-80 years, 20 pack-year currently smoking OR have quit w/in 15years.) does not qualify.    Lung Cancer Screening Referral:   Additional Screening:  Hepatitis C Screening: does qualify; Completed   Vision Screening: Recommended annual ophthalmology exams for early detection of glaucoma and other disorders of the eye. Is the patient up to date with their annual eye exam?  Yes  Who is the provider or what is the name of the office in which the patient attends annual eye exams? Vision center If pt is not established with a provider, would they like to be referred to a provider to establish care? No .   Dental Screening: Recommended annual dental exams for proper oral hygiene  Diabetic Foot Exam:   Community Resource Referral / Chronic Care Management: CRR required this visit?  No   CCM required this visit?  No     Plan:     I have personally reviewed and noted the following in the patient's chart:   Medical and social history Use of alcohol, tobacco or illicit drugs  Current medications and supplements including opioid prescriptions. Patient is not currently taking opioid prescriptions. Functional ability and status Nutritional status Physical activity Advanced directives List of other physicians Hospitalizations, surgeries, and ER visits in previous 12 months Vitals Screenings to include cognitive, depression, and falls Referrals and appointments  In  addition, I have reviewed and discussed with patient certain preventive protocols, quality metrics, and best practice recommendations. A written personalized care plan for preventive services as well as general preventive health recommendations were provided to patient.     Alvina Axon   08/12/2023   After Visit Summary: (MyChart) Due to this being a telephonic visit, the after visit summary with patients personalized plan was offered to patient via MyChart   Nurse Notes:   Mr. Overdorf , Thank you for taking time to come for your Medicare Wellness Visit. I appreciate your ongoing commitment to your health  goals. Please review the following plan we discussed and let me know if I can assist you in the future.   These are the goals we discussed:  Goals      Activity and Exercise Increased     Evidence-based guidance:  Review current exercise levels.  Assess patient perspective on exercise or activity level, barriers to increasing activity, motivation and readiness for change.  Recommend or set healthy exercise goal based on individual tolerance.  Encourage small steps toward making change in amount of exercise or activity.  Urge reduction of sedentary activities or screen time.  Promote group activities within the community or with family or support person.  Consider referral to rehabiliation therapist for assessment and exercise/activity plan.   Notes:         This is a list of the screening recommended for you and due dates:  Health Maintenance  Topic Date Due   Zoster (Shingles) Vaccine (1 of 2) Never done   COVID-19 Vaccine (3 - Pfizer risk series) 08/18/2023*   Flu Shot  11/15/2023   Medicare Annual Wellness Visit  07/31/2024   Colon Cancer Screening  10/05/2025   Pneumonia Vaccine  Completed   Hepatitis C Screening  Completed   HPV Vaccine  Aged Out   Meningitis B Vaccine  Aged Out   DTaP/Tdap/Td vaccine  Discontinued  *Topic was postponed. The date shown is not the original due date.

## 2023-09-06 ENCOUNTER — Other Ambulatory Visit: Payer: Self-pay | Admitting: Family Medicine

## 2023-09-10 NOTE — Telephone Encounter (Signed)
 Requested Prescriptions  Pending Prescriptions Disp Refills   pantoprazole  (PROTONIX ) 40 MG tablet [Pharmacy Med Name: Pantoprazole  Sodium 40 MG Oral Tablet Delayed Release] 90 tablet 0    Sig: Take 1 tablet by mouth once daily     Gastroenterology: Proton Pump Inhibitors Passed - 09/10/2023  9:58 AM      Passed - Valid encounter within last 12 months    Recent Outpatient Visits           1 month ago Herpes zoster with other complication   [place] [facility] Amadeo June, MD   1 month ago Benign essential HTN   [place] [facility] Austine Lefort, MD   4 months ago Back muscle spasm   [place] [facility] Medicine Amadeo June, MD   8 months ago Hepatic steatosis   [place] [facility] Medicine Austine Lefort, MD   11 months ago Benign essential HTN   [place] [facility] Pickard, Cisco Crest, MD

## 2023-09-18 ENCOUNTER — Other Ambulatory Visit: Payer: Self-pay | Admitting: Family Medicine

## 2023-09-19 NOTE — Telephone Encounter (Signed)
 Requested Prescriptions  Pending Prescriptions Disp Refills   rosuvastatin  (CRESTOR ) 20 MG tablet [Pharmacy Med Name: Rosuvastatin  Calcium  20 MG Oral Tablet] 45 tablet 1    Sig: Take 1/2 (one-half) tablet by mouth once daily     Cardiovascular:  Antilipid - Statins 2 Failed - 09/19/2023  5:26 PM      Failed - Lipid Panel in normal range within the last 12 months    Cholesterol  Date Value Ref Range Status  08/02/2023 124 <200 mg/dL Final   LDL Cholesterol (Calc)  Date Value Ref Range Status  08/02/2023 53 mg/dL (calc) Final    Comment:    Reference range: <100 . Desirable range <100 mg/dL for primary prevention;   <70 mg/dL for patients with CHD or diabetic patients  with > or = 2 CHD risk factors. Aaron Aas LDL-C is now calculated using the Martin-Hopkins  calculation, which is a validated novel method providing  better accuracy than the Friedewald equation in the  estimation of LDL-C.  Melinda Sprawls et al. Erroll Heard. 1610;960(45): 2061-2068  (http://education.[facility].com/faq/FAQ164)    Direct LDL  Date Value Ref Range Status  07/06/2019 63 <100 mg/dL Final    Comment:    Greatly elevated Triglycerides values (>1200 mg/dL) interfere with the dLDL assay. As no Triglycerides  testing was ordered, interpret results with caution. . Desirable range <100 mg/dL for primary prevention;   <70 mg/dL for patients with CHD or diabetic patients  with > or = 2 CHD risk factors. Aaron Aas    HDL  Date Value Ref Range Status  08/02/2023 52 > OR = 40 mg/dL Final   Triglycerides  Date Value Ref Range Status  08/02/2023 106 <150 mg/dL Final         Passed - Cr in normal range and within 360 days    Creat  Date Value Ref Range Status  08/02/2023 1.00 0.70 - 1.28 mg/dL Final         Passed - Patient is not pregnant      Passed - Valid encounter within last 12 months    Recent Outpatient Visits           1 month ago Herpes zoster with other complication   [place] [facility]  [facility]icine Amadeo June, MD   1 month ago Benign essential HTN   [place] [facility] Austine Lefort, MD   4 months ago Back muscle spasm   [place] [facility] Amadeo June, MD   8 months ago Hepatic steatosis   [place] [facility] Austine Lefort, MD   11 months ago Benign essential HTN   [place] [facility] Pickard, Cisco Crest, MD

## 2023-10-29 ENCOUNTER — Other Ambulatory Visit: Payer: Self-pay | Admitting: Family Medicine

## 2023-10-29 DIAGNOSIS — R079 Chest pain, unspecified: Secondary | ICD-10-CM

## 2023-11-21 ENCOUNTER — Other Ambulatory Visit: Payer: Self-pay | Admitting: Family Medicine

## 2023-12-26 ENCOUNTER — Encounter: Payer: Self-pay | Admitting: Family Medicine

## 2023-12-26 ENCOUNTER — Ambulatory Visit: Admitting: Family Medicine

## 2023-12-26 VITALS — BP 142/82 | HR 71 | Ht 68.0 in | Wt 229.0 lb

## 2023-12-26 DIAGNOSIS — G629 Polyneuropathy, unspecified: Secondary | ICD-10-CM | POA: Diagnosis not present

## 2023-12-26 LAB — VITAMIN B12: Vitamin B-12: 512 pg/mL (ref 200–1100)

## 2023-12-26 LAB — COMPREHENSIVE METABOLIC PANEL WITH GFR
AG Ratio: 1.5 (calc) (ref 1.0–2.5)
ALT: 26 U/L (ref 9–46)
AST: 24 U/L (ref 10–35)
Albumin: 4.4 g/dL (ref 3.6–5.1)
Alkaline phosphatase (APISO): 138 U/L (ref 35–144)
BUN: 9 mg/dL (ref 7–25)
CO2: 28 mmol/L (ref 20–32)
Calcium: 9.2 mg/dL (ref 8.6–10.3)
Chloride: 105 mmol/L (ref 98–110)
Creat: 1.01 mg/dL (ref 0.70–1.28)
Globulin: 2.9 g/dL (ref 1.9–3.7)
Glucose, Bld: 100 mg/dL — ABNORMAL HIGH (ref 65–99)
Potassium: 3.7 mmol/L (ref 3.5–5.3)
Sodium: 139 mmol/L (ref 135–146)
Total Bilirubin: 0.6 mg/dL (ref 0.2–1.2)
Total Protein: 7.3 g/dL (ref 6.1–8.1)
eGFR: 80 mL/min/1.73m2 (ref >=60)

## 2023-12-26 MED ORDER — ESOMEPRAZOLE MAGNESIUM 40 MG PO CPDR: 40.0000 mg | DELAYED_RELEASE_CAPSULE | Freq: Every day | ORAL | 3 refills | Status: AC

## 2023-12-26 MED ORDER — LOSARTAN POTASSIUM 100 MG PO TABS: 100.0000 mg | ORAL_TABLET | Freq: Every day | ORAL | 3 refills | Status: AC

## 2023-12-26 MED ORDER — GABAPENTIN 300 MG PO CAPS: 300.0000 mg | ORAL_CAPSULE | Freq: Two times a day (BID) | ORAL | 3 refills | Status: AC | PRN

## 2023-12-26 NOTE — Progress Notes (Signed)
 Subjective:    Patient ID: Stephen Butler, male    DOB: 12-26-1950, 73 y.o.   MRN: [id number]  Leg Pain      Patient is a very pleasant 73 year old African-American man who had carotid dopplers 7/24: Summary:  Right Carotid: Velocities in the right ICA are consistent with a 40-59%                 stenosis. The ECA appears >50% stenosed.   Left Carotid: Velocities in the left ICA are consistent with a 1-39%  stenosis.  Sees vascular surgery annually with [facility].    Patient has a history of restless leg syndrome for which she takes Mirapex  at night.  He states that the Mirapex  is helping the burning aching pain in his legs at night when he lies down.  However he is also having burning pain in his legs during the daytime especially when walking.  He has a 2/4 dorsalis pedis pulse bilaterally.  There is no evidence of peripheral vascular disease.  His toes appear well-perfused with normal capillary refills.  However he reports burning stinging pain distal to the knees in both legs with walking during the daytime. Past Medical History:  Diagnosis Date   Arthritis    Epistaxis    GERD (gastroesophageal reflux disease)    Hematochezia    Hypertension    Microscopic hematuria    Neuromuscular disorder (HCC)    neuropathy   Stenosis of right carotid artery greater than 50%    Thyroid  nodule    repeat us  in 1 year (8/24)   Past Surgical History:  Procedure Laterality Date   COLONOSCOPY     SHOULDER SURGERY     Current Outpatient Medications on File Prior to Visit  Medication Sig Dispense Refill   alprostadil  (EDEX ) 10 MCG injection 2.5 mcg by Intracavitary route as needed for erectile dysfunction. use no more than 3 times per week 1 each 12   AMBULATORY NON FORMULARY MEDICATION Medication Name: Prostaglandin  E1 20 mcg/mL Inject 1/2-1 mL prn Disp 5 20 mcg vials  0 rf 1 vial 0   AMBULATORY NON FORMULARY MEDICATION PAP30/PHE1/PGE20  Inject 0.5-1.0 mL prn 5 vial 11    amLODipine  (NORVASC ) 10 MG tablet Take 1 tablet by mouth once daily 90 tablet 0   aspirin  EC 81 MG tablet Take by mouth.     carvedilol (COREG) 6.25 MG tablet Take 12.5 mg by mouth daily.     cyclobenzaprine  (FLEXERIL ) 10 MG tablet Take 1 tablet (10 mg total) by mouth at bedtime. Take at night time 30 tablet 0   doxazosin  (CARDURA ) 4 MG tablet TAKE 1 TABLET BY MOUTH ONCE DAILY *STOP CLONIDINE * 30 tablet 0   gabapentin  (NEURONTIN ) 100 MG capsule Take 1 capsule (100 mg total) by mouth 3 (three) times daily. Start taking one at night time and inc up to 3 at night (for burning) 90 capsule 0   losartan  (COZAAR ) 50 MG tablet Take 2 tablets (100 mg total) by mouth daily. 08/02/23-Pt states he is taking 100 mg 60 tablet 5   meclizine  (ANTIVERT ) 25 MG tablet Take 1 tablet (25 mg total) by mouth 3 (three) times daily as needed for dizziness. 30 tablet 0   Multiple Vitamin (MULTIVITAMIN) capsule Take 1 capsule by mouth daily.     pantoprazole  (PROTONIX ) 40 MG tablet Take 1 tablet by mouth once daily 90 tablet 0   pramipexole  (MIRAPEX ) 0.125 MG tablet Take 2 tablets (0.25 mg total) by  mouth at bedtime. 60 tablet 1   rosuvastatin  (CRESTOR ) 20 MG tablet Take 1/2 (one-half) tablet by mouth once daily 45 tablet 1   valACYclovir  (VALTREX ) 1000 MG tablet Take 1 tablet (1,000 mg total) by mouth 3 (three) times daily. 21 tablet 0   No current facility-administered medications on file prior to visit.    No Known Allergies Social History   Socioeconomic History   Marital status: Married    Spouse name: Not on file   Number of children: 0   Years of education: Not on file   Highest education level: Not on file  Occupational History   Occupation: retired  Tobacco Use   Smoking status: Never   Smokeless tobacco: Never  Vaping Use   Vaping status: Never Used  Substance and Sexual Activity   Alcohol use: No   Drug use: No   Sexual activity: Yes    Partners: Female  Other Topics Concern   Not on file   Social History Narrative   Retired    Social Drivers of Corporate investment banker Strain: Low Risk  (08/01/2023)   Overall Financial Resource Strain (CARDIA)    Difficulty of Paying Living Expenses: Not hard at all  Food Insecurity: No Food Insecurity (08/01/2023)   Hunger Vital Sign    Worried About Running Out of Food in the Last Year: Never true    Ran Out of Food in the Last Year: Never true  Transportation Needs: No Transportation Needs (08/01/2023)   PRAPARE - Administrator, Civil Service (Medical): No    Lack of Transportation (Non-Medical): No  Physical Activity: Insufficiently Active (08/01/2023)   Exercise Vital Sign    Days of Exercise per Week: 2 days    Minutes of Exercise per Session: 60 min  Stress: No Stress Concern Present (08/01/2023)   [employer] of Occupational Health - Occupational Stress Questionnaire    Feeling of Stress : Not at all  Social Connections: Socially Integrated (08/01/2023)   Social Connection and Isolation Panel    Frequency of Communication with Friends and Family: More than three times a week    Frequency of Social Gatherings with Friends and Family: More than three times a week    Attends Religious Services: More than 4 times per year    Active Member of [employer] or Organizations: Yes    Attends Engineer, structural: More than 4 times per year    Marital Status: Married  Catering manager Violence: Not At Risk (08/01/2023)   Humiliation, Afraid, Rape, and Kick questionnaire    Fear of Current or Ex-Partner: No    Emotionally Abused: No    Physically Abused: No    Sexually Abused: No   Family History  Problem Relation Age of Onset   Hypertension Mother    Heart attack Mother    Stroke Father    Colon polyps Neg Hx    Colon cancer Neg Hx    Esophageal cancer Neg Hx    Rectal cancer Neg Hx    Stomach cancer Neg Hx      Review of Systems  All other systems reviewed and are negative.      Objective:    Physical Exam Vitals reviewed.  Constitutional:      General: He is not in acute distress.    Appearance: Normal appearance. He is well-developed and normal weight. He is not ill-appearing, toxic-appearing or diaphoretic.  HENT:     Head: Normocephalic and atraumatic.  Right Ear: Tympanic membrane and ear canal normal.     Left Ear: Tympanic membrane and ear canal normal.     Nose: Nose normal. No congestion or rhinorrhea.     Mouth/Throat:     Mouth: Mucous membranes are moist.     Pharynx: Oropharynx is clear. No oropharyngeal exudate or posterior oropharyngeal erythema.  Eyes:     Extraocular Movements: Extraocular movements intact.     Conjunctiva/sclera: Conjunctivae normal.     Pupils: Pupils are equal, round, and reactive to light.  Neck:     Vascular: Carotid bruit present.  Cardiovascular:     Rate and Rhythm: Normal rate and regular rhythm.     Heart sounds: Normal heart sounds. No murmur heard.    No friction rub. No gallop.  Pulmonary:     Effort: Pulmonary effort is normal. No respiratory distress.     Breath sounds: Normal breath sounds. No stridor. No wheezing, rhonchi or rales.  Chest:     Chest wall: No tenderness.  Abdominal:     General: Bowel sounds are normal. There is no distension.     Palpations: Abdomen is soft.     Tenderness: There is no abdominal tenderness. There is no guarding or rebound.  Genitourinary:    Penis: Normal.      Testes: Normal.  Musculoskeletal:        General: No swelling, tenderness, deformity or signs of injury.     Cervical back: Neck supple.     Right lower leg: No edema.     Left lower leg: No edema.  Lymphadenopathy:     Cervical: No cervical adenopathy.  Skin:    Coloration: Skin is not jaundiced.     Findings: No bruising, erythema, lesion or rash.  Neurological:     General: No focal deficit present.     Mental Status: He is alert and oriented to person, place, and time. Mental status is at baseline.     Cranial  Nerves: No cranial nerve deficit.     Sensory: No sensory deficit.     Motor: No weakness or abnormal muscle tone.     Coordination: Coordination normal.     Gait: Gait normal.     Deep Tendon Reflexes: Reflexes are normal and symmetric.  Psychiatric:        Mood and Affect: Mood normal.        Behavior: Behavior normal.        Thought Content: Thought content normal.        Judgment: Judgment normal.           Assessment & Plan:  Neuropathy - Plan: Vitamin B12, Comprehensive metabolic panel with GFR Mirapex  has helped the restless leg however he is also dealing with what sounds like peripheral neuropathy.  His blood sugar and his thyroid  were recently checked this year and were normal.  I will check a B12 to look for reversible causes of neuropathy.  Meanwhile start gabapentin  300 mg daily.  He can uptitrate to twice daily if beneficial.  Continue Mirapex  at night.  Blood pressure is acceptable.

## 2023-12-27 ENCOUNTER — Ambulatory Visit: Payer: Self-pay | Admitting: Family Medicine

## 2024-01-13 DIAGNOSIS — I779 Disorder of arteries and arterioles, unspecified: Secondary | ICD-10-CM | POA: Diagnosis not present

## 2024-01-14 ENCOUNTER — Telehealth: Payer: Self-pay

## 2024-01-14 NOTE — Telephone Encounter (Signed)
 Copied from CRM #[id number]. Topic: Referral - Question >> Jan 14, 2024  2:11 PM DeAngela L wrote: Reason for CRM: patient calling to ask if Dr Duanne can give him a referral to an ENT specialist  Pt num [phone] (M)   [facility] [address]  Phone num [phone] Fax num [phone]

## 2024-01-27 ENCOUNTER — Other Ambulatory Visit: Payer: Self-pay | Admitting: Family Medicine

## 2024-01-27 ENCOUNTER — Other Ambulatory Visit: Payer: Self-pay

## 2024-01-27 DIAGNOSIS — R079 Chest pain, unspecified: Secondary | ICD-10-CM

## 2024-02-03 IMAGING — DX DG RIBS W/ CHEST 3+V*L*
5 series · 5 of 5 positions shown · non-contrast
Comparison: None.

CLINICAL DATA: Left rib pain. Radiographs of the chest and left
ribs

EXAM:
LEFT RIBS AND CHEST - 3+ VIEW

[dg ribs unilateral w/chest left (1 of 5)]
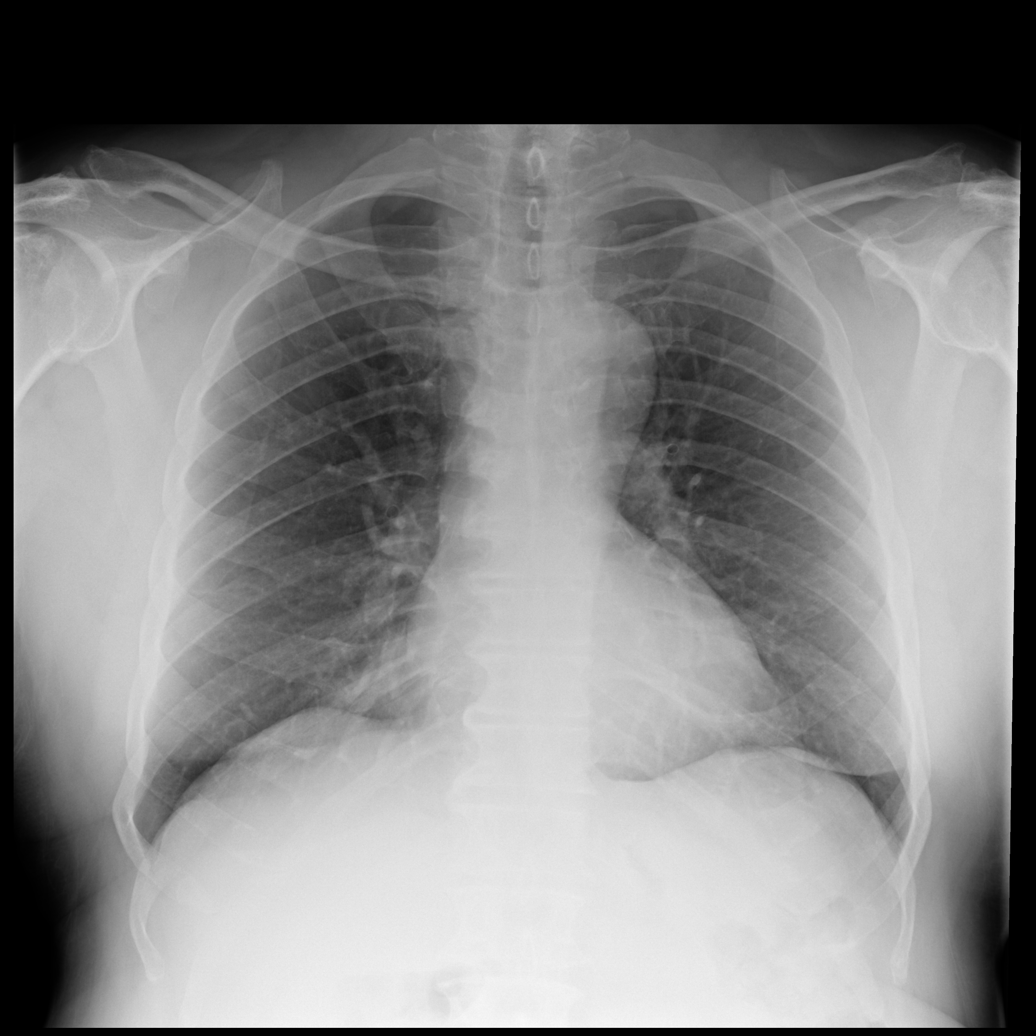

[dg ribs unilateral w/chest left (2 of 5)]
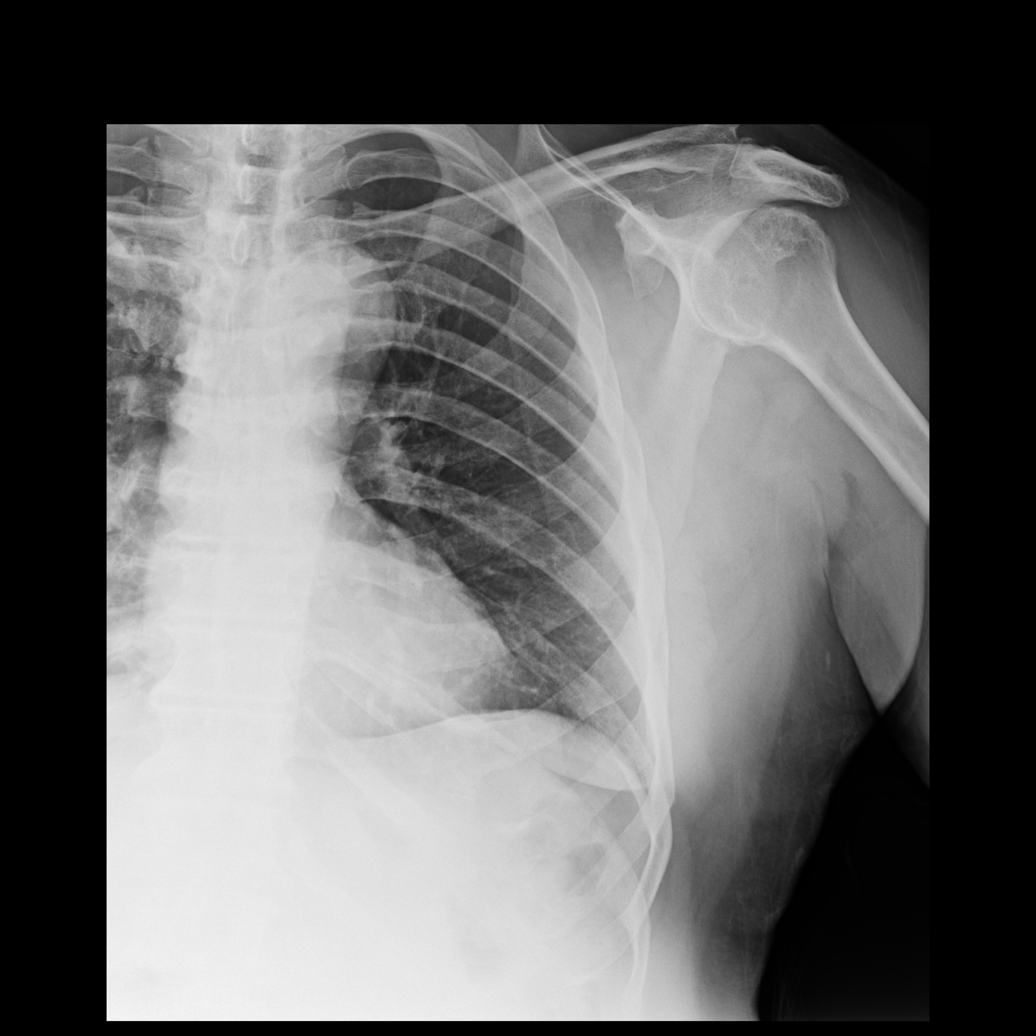

[dg ribs unilateral w/chest left (3 of 5)]
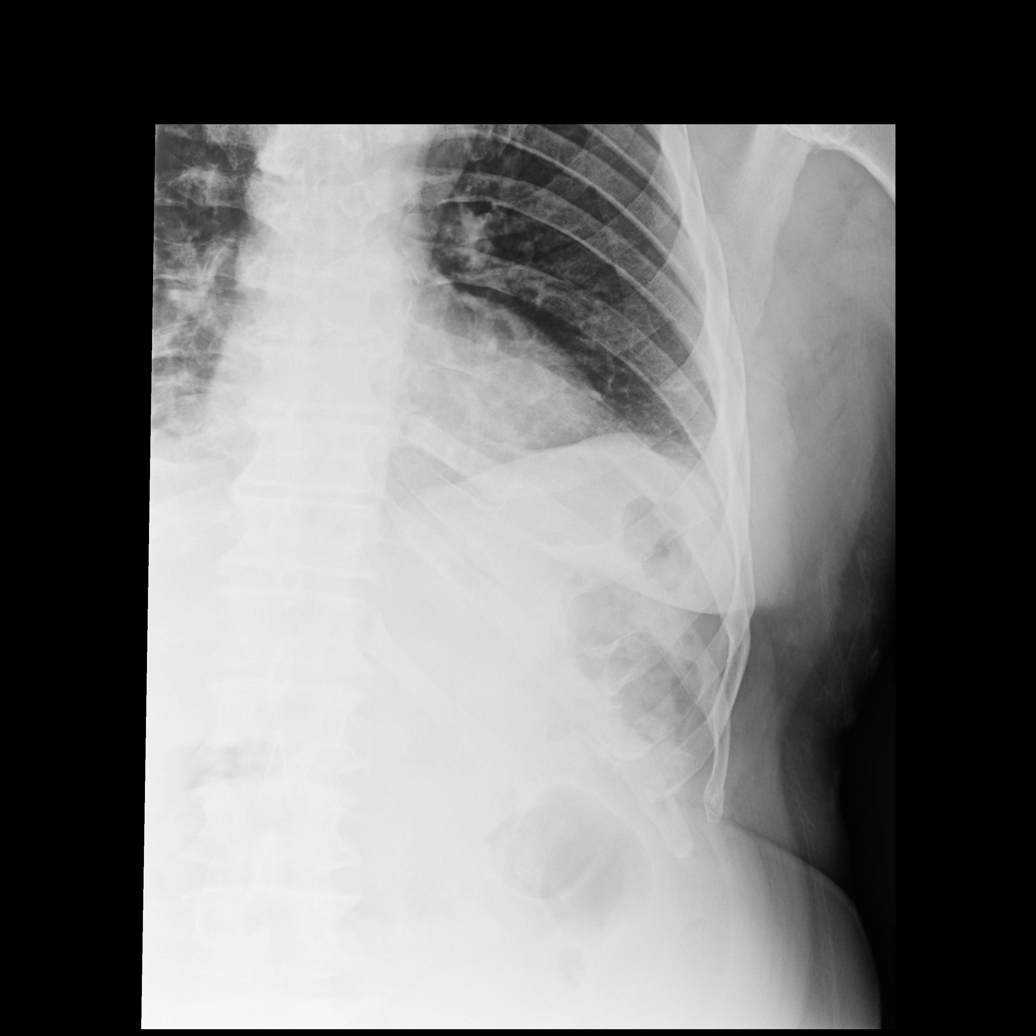

[dg ribs unilateral w/chest left (4 of 5)]
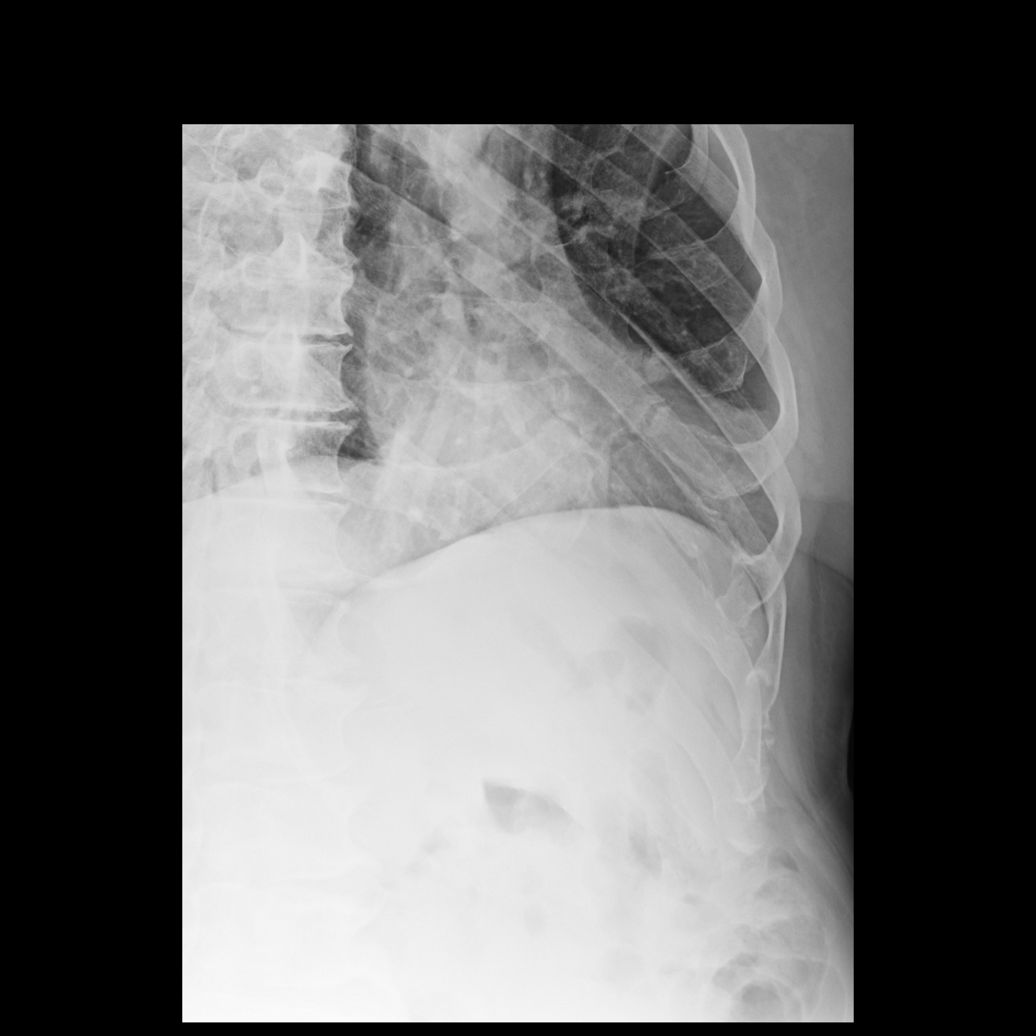

[dg ribs unilateral w/chest left (5 of 5)]
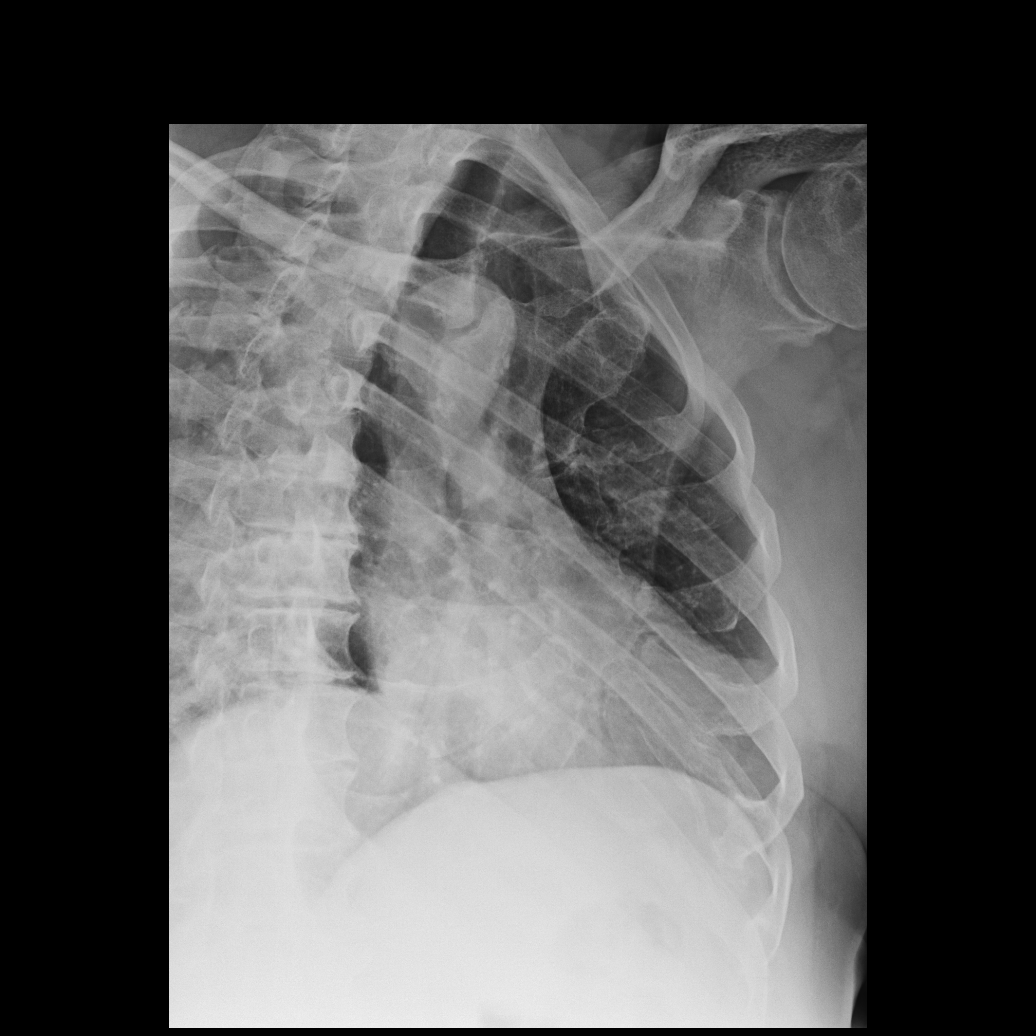

[5 of 5 positions shown; findings below may reference images not displayed]

FINDINGS: No fracture or other bone lesions are seen involving the ribs. There
is no evidence of pneumothorax or pleural effusion. Both lungs are
clear. Heart size and mediastinal contours are within normal limits.
IMPRESSION: Negative radiographs of the chest and left ribs.

## 2024-02-18 DIAGNOSIS — I6523 Occlusion and stenosis of bilateral carotid arteries: Secondary | ICD-10-CM | POA: Diagnosis not present

## 2024-02-18 DIAGNOSIS — I6521 Occlusion and stenosis of right carotid artery: Secondary | ICD-10-CM | POA: Diagnosis not present

## 2024-02-26 ENCOUNTER — Other Ambulatory Visit: Payer: Self-pay | Admitting: Family Medicine

## 2024-03-10 ENCOUNTER — Other Ambulatory Visit: Payer: Self-pay | Admitting: Family Medicine

## 2024-04-06 NOTE — Progress Notes (Signed)
 Pharmacy Quality Measure Review  This patient is appearing on a report for being at risk of failing the adherence measure for hypertension (ACEi/ARB) medications this calendar year.   Medication: Losartan  100 mg Last fill date: 03/22/24 for 90 day supply, sold 03/28/24  Insurance report was not up to date. No action needed at this time.   Jenkins Graces, PharmD PGY1 Pharmacy Resident
